# Patient Record
Sex: Male | Born: 1990
Health system: Southern US, Community
[De-identification: ages and names within clinical notes are randomized; demographics above are authoritative.]

## PROBLEM LIST (undated history)

## (undated) DIAGNOSIS — I10 Essential (primary) hypertension: Secondary | ICD-10-CM

## (undated) DIAGNOSIS — E119 Type 2 diabetes mellitus without complications: Secondary | ICD-10-CM

## (undated) HISTORY — PX: APPENDECTOMY: SHX54

---
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
## 2009-12-16 ENCOUNTER — Emergency Department (HOSPITAL_COMMUNITY): Admission: EM | Admit: 2009-12-16 | Discharge: 2009-12-16 | Payer: Self-pay | Admitting: Emergency Medicine

## 2010-07-05 LAB — DIFFERENTIAL
Basophils Absolute: 0 10*3/uL (ref 0.0–0.1)
Lymphocytes Relative: 15 % (ref 12–46)
Lymphs Abs: 1.6 10*3/uL (ref 0.7–4.0)
Monocytes Absolute: 0.5 10*3/uL (ref 0.1–1.0)
Monocytes Relative: 5 % (ref 3–12)
Neutro Abs: 8.9 10*3/uL — ABNORMAL HIGH (ref 1.7–7.7)

## 2010-07-05 LAB — CBC
Hemoglobin: 14.1 g/dL (ref 13.0–17.0)
RBC: 4.62 MIL/uL (ref 4.22–5.81)
WBC: 11.1 10*3/uL — ABNORMAL HIGH (ref 4.0–10.5)

## 2010-07-05 LAB — BASIC METABOLIC PANEL
Calcium: 10 mg/dL (ref 8.4–10.5)
GFR calc Af Amer: >60 mL/min (ref >60)
GFR calc non Af Amer: >60 mL/min (ref >60)
Sodium: 139 mEq/L (ref 135–145)

## 2010-07-05 LAB — URINALYSIS, ROUTINE W REFLEX MICROSCOPIC
Bilirubin Urine: NEGATIVE
Hgb urine dipstick: NEGATIVE
Nitrite: NEGATIVE
Specific Gravity, Urine: 1.03 (ref 1.005–1.030)
pH: 7 (ref 5.0–8.0)

## 2010-08-11 NOTE — Op Note (Signed)
Rodney Rush, Rodney Rush NO.:  [id number]   MEDICAL RECORD NO.:  [id number]          PATIENT TYPE:  INP   LOCATION:  1859                         FACILITY:  [facility]   PHYSICIAN:  Wilmon Arms. Corliss Skains, M.D. DATE OF BIRTH:  12-12-1990   DATE OF PROCEDURE:  11/10/2008  DATE OF DISCHARGE:                               OPERATIVE REPORT   PREOPERATIVE DIAGNOSIS:  Acute appendicitis.   POSTOPERATIVE DIAGNOSIS:  Acute appendicitis.   PROCEDURE PERFORMED:  Laparoscopic appendectomy.   SURGEON:  Wilmon Arms. Tsuei, MD   ANESTHESIA:  General endotracheal.   INDICATIONS:  This is a 20 year old male who presents with a one-day  history of right lower quadrant pain, nausea and diarrhea.  His white  count is 11.1.  A CT scan showed a thickened appendix with inflammatory  changes, but no sign of abscess.  He presents now for emergent  appendectomy.   DESCRIPTION OF PROCEDURE:  The patient was brought to the operating room  and placed in supine position on the operating table.  After an adequate  level of general anesthesia was obtained, the patient's abdomen was  prepped with Betadine and draped in sterile fashion.  A Foley catheter  has previously been placed.  He also received preoperative antibiotics  of Unasyn 3 g.   A time-out was taken to assure the proper patient and proper procedure.  We infiltrated the area below his umbilicus with 0.25% Marcaine with  epinephrine.  We made a transverse incision and dissection was carried  down to the fascia.  The fascia was opened vertically.  A stay sutures  of 0 Vicryl was placed around the fascial opening.  Pneumoperitoneum was  obtained by inserting a Hasson cannula, secured with stay sutures and  insufflating CO2 maintaining maximal pressure of 15 mmHg.  The  laparoscope was then inserted.  He was positioned in Trendelenburg and  tilted to his left.  A 5-mm port was placed in the right upper quadrant  and another 5-mm port was  placed in the left lower quadrant.  The cecum  was mobilized medially.  We identified the tip of the appendix.  There  is some fibrinous exudate, but no gross purulence and no sign of  perforation.  We grasped the tip of the appendix with a Glassman clamp.  The visualized appendix was quite short.  I felt that this did not  represent the entire appendix.  The appendix actually had congenital  adhesions to the lateral abdominal wall and tracked in a retrocecal  fashion.  We then spent considerable amount of time mobilizing the  entire appendix back to the base at the cecum.  The base of the appendix  was then divided with Endo-GIA stapler.  The appendix was placed in  EndoCatch sac and removed with umbilical port site.  We reinspected the  staple line and no bleeding was noted.  We thoroughly irrigated the  right lower quadrant.  Hemostasis was good.  We removed the trocars as  pneumoperitoneum was  released.  The umbilical fascia was closed with a pursestring suture.  A  4-0  Monocryl was used to close the skin incisions.  Steri-Strips and  clean dressings were applied.  The patient was then extubated and  brought to recovery room in stable condition.  All sponge, instrument,  and needle counts were correct.      Wilmon Arms. Tsuei, M.D.  Electronically Signed     MKT/MEDQ  D:  11/10/2008  T:  11/10/2008  Job:  [id number]

## 2011-04-29 ENCOUNTER — Encounter (HOSPITAL_COMMUNITY): Payer: Self-pay | Admitting: Emergency Medicine

## 2011-04-29 ENCOUNTER — Emergency Department (HOSPITAL_COMMUNITY): Payer: BC Managed Care – PPO

## 2011-04-29 ENCOUNTER — Emergency Department (HOSPITAL_COMMUNITY)
Admission: EM | Admit: 2011-04-29 | Discharge: 2011-04-29 | Disposition: A | Discharge status: Home / Self Care | Payer: BC Managed Care – PPO | Attending: Emergency Medicine | Admitting: Emergency Medicine

## 2011-04-29 DIAGNOSIS — M79609 Pain in unspecified limb: Secondary | ICD-10-CM | POA: Insufficient documentation

## 2011-04-29 DIAGNOSIS — T148XXA Other injury of unspecified body region, initial encounter: Secondary | ICD-10-CM

## 2011-04-29 DIAGNOSIS — W268XXA Contact with other sharp object(s), not elsewhere classified, initial encounter: Secondary | ICD-10-CM | POA: Insufficient documentation

## 2011-04-29 DIAGNOSIS — S91309A Unspecified open wound, unspecified foot, initial encounter: Secondary | ICD-10-CM | POA: Insufficient documentation

## 2011-04-29 MED ORDER — IBUPROFEN 800 MG PO TABS: 800.0000 mg | ORAL_TABLET | Freq: Once | ORAL | Status: AC

## 2011-04-29 MED ORDER — IBUPROFEN 800 MG PO TABS
800.0000 mg | ORAL_TABLET | Freq: Once | ORAL | Status: AC
  Administered 2011-04-29: 800 mg via ORAL
  Filled 2011-04-29: qty 1

## 2011-04-29 MED ORDER — TETANUS-DIPHTH-ACELL PERTUSSIS 5-2.5-18.5 LF-MCG/0.5 IM SUSP
0.5000 mL | Freq: Once | INTRAMUSCULAR | Status: AC
  Administered 2011-04-29: 0.5 mL via INTRAMUSCULAR
  Filled 2011-04-29: qty 0.5

## 2011-04-29 NOTE — ED Provider Notes (Signed)
History     CSN: [id number]  Arrival date & time 04/29/11  0030   First MD Initiated Contact with Patient 04/29/11 0050      Chief Complaint  Patient presents with  . Puncture Wound    (Consider location/radiation/quality/duration/timing/severity/associated sxs/prior treatment) HPI Comments: Stepped on a nail in the leads at 6:00 tonight through a shoe initially didn't think about it, but it started to throb.  Several hours later.  There is a small puncture to the sole of his right foot, just beneath the ball of the foot it's not red and inflamed.     The history is provided by the patient.    History reviewed. No pertinent past medical history.  History reviewed. No pertinent past surgical history.  No family history on file.  History  Substance Use Topics  . Smoking status: Never Smoker   . Smokeless tobacco: Not on file  . Alcohol Use: No      Review of Systems  Constitutional: Negative for fever and chills.  Musculoskeletal: Negative for joint swelling.  Skin: Positive for wound.    Allergies  Review of patient's allergies indicates no known allergies.  Home Medications   Current Outpatient Rx  Name Route Sig Dispense Refill  . IBUPROFEN 200 MG PO TABS Oral Take 200 mg by mouth every 6 (six) hours as needed. headache    . IBUPROFEN 800 MG PO TABS Oral Take 1 tablet (800 mg total) by mouth once. 30 tablet 0    BP 133/76  Pulse 91  Temp(Src) 98 F (36.7 C) (Oral)  Resp 18  SpO2 94%  Physical Exam  Constitutional: He is oriented to person, place, and time. He appears well-developed and well-nourished.  Neck: Normal range of motion.  Cardiovascular: Normal rate.   Musculoskeletal:       Small puncture wound to the sole of the right foot, just below the ball.  No erythema, drainage, no foreign body, felt  Neurological: He is alert and oriented to person, place, and time.  Skin: Skin is warm and dry.    ED Course  Procedures (including critical care  time)  Labs Reviewed - No data to display Dg Foot 2 Views Right  04/29/2011  *RADIOLOGY REPORT*  Clinical Data: Stepped on nail.  Marker over injury between fourth and fifth metatarsal.  RIGHT FOOT - 2 VIEW  Comparison: 12/16/2009  Findings: No radiopaque foreign bodies in the soft tissues.  Bones appear intact.  No evidence of acute fracture or subluxation.  No focal bone lesion or bone destruction.  Bone cortex and trabecular architecture appear intact.  No focal soft tissue swelling or gas.  IMPRESSION: No acute bony abnormalities.  No radiopaque foreign bodies.  Original Report Authenticated By: Marlon Pel, M.D.     1. Puncture wound       MDM  Puncture wound to the sole of the right, foot.  We'll x-ray to verify foreign body versus no foreign, body.  We'll update tetanus        Arman Filter, NP 04/29/11 0100  Arman Filter, NP 04/29/11 [phone]

## 2011-04-29 NOTE — ED Notes (Signed)
PT. REPORTS STEPPED ON NAIL THIS EVENING AT RIGHT FOOT , NO BLEEDING , TETANUS IMMUNIZATION > 10 YEARS AGO.

## 2011-04-29 NOTE — ED Notes (Signed)
Patient transported to X-ray 

## 2011-04-30 NOTE — ED Provider Notes (Signed)
Medical screening examination/treatment/procedure(s) were performed by non-physician practitioner and as supervising physician I was immediately available for consultation/collaboration.   Jaysion Ramseyer M Daryle Boyington, DO 04/30/11 0854 

## 2013-09-12 ENCOUNTER — Other Ambulatory Visit: Payer: Self-pay | Admitting: Family Medicine

## 2013-09-12 ENCOUNTER — Ambulatory Visit
Admission: RE | Admit: 2013-09-12 | Discharge: 2013-09-12 | Disposition: A | Discharge status: Home / Self Care | Payer: BC Managed Care – PPO | Source: Ambulatory Visit | Attending: Family Medicine | Admitting: Family Medicine

## 2013-09-12 DIAGNOSIS — M79672 Pain in left foot: Secondary | ICD-10-CM

## 2015-08-06 ENCOUNTER — Emergency Department (HOSPITAL_BASED_OUTPATIENT_CLINIC_OR_DEPARTMENT_OTHER)
Admission: EM | Admit: 2015-08-06 | Discharge: 2015-08-07 | Disposition: A | Discharge status: Home / Self Care | Payer: Self-pay | Attending: Dermatology | Admitting: Dermatology

## 2015-08-06 ENCOUNTER — Encounter (HOSPITAL_BASED_OUTPATIENT_CLINIC_OR_DEPARTMENT_OTHER): Payer: Self-pay

## 2015-08-06 DIAGNOSIS — Z5321 Procedure and treatment not carried out due to patient leaving prior to being seen by health care provider: Secondary | ICD-10-CM | POA: Insufficient documentation

## 2015-08-06 DIAGNOSIS — Z202 Contact with and (suspected) exposure to infections with a predominantly sexual mode of transmission: Secondary | ICD-10-CM | POA: Insufficient documentation

## 2015-08-06 NOTE — ED Notes (Signed)
Pt not in ED WR when called for tx area 

## 2015-08-06 NOTE — ED Notes (Signed)
Pt reports wife positive for STD-denies penile d/c and dysuria-NAD-steady gait

## 2015-08-06 NOTE — ED Notes (Signed)
second call to waiting area no reply from pt

## 2015-08-07 ENCOUNTER — Encounter (HOSPITAL_BASED_OUTPATIENT_CLINIC_OR_DEPARTMENT_OTHER): Payer: Self-pay

## 2015-08-07 ENCOUNTER — Emergency Department (HOSPITAL_BASED_OUTPATIENT_CLINIC_OR_DEPARTMENT_OTHER)
Admission: EM | Admit: 2015-08-07 | Discharge: 2015-08-07 | Disposition: A | Discharge status: Home / Self Care | Payer: BLUE CROSS/BLUE SHIELD | Attending: Emergency Medicine | Admitting: Emergency Medicine

## 2015-08-07 DIAGNOSIS — Z202 Contact with and (suspected) exposure to infections with a predominantly sexual mode of transmission: Secondary | ICD-10-CM | POA: Insufficient documentation

## 2015-08-07 MED ORDER — LIDOCAINE HCL (PF) 1 % IJ SOLN
INTRAMUSCULAR | Status: AC
  Administered 2015-08-07: 0.9 mL
  Filled 2015-08-07: qty 5

## 2015-08-07 MED ORDER — AZITHROMYCIN 250 MG PO TABS
1000.0000 mg | ORAL_TABLET | Freq: Once | ORAL | Status: AC
  Administered 2015-08-07: 1000 mg via ORAL
  Filled 2015-08-07: qty 4

## 2015-08-07 MED ORDER — CEFTRIAXONE SODIUM 250 MG IJ SOLR
250.0000 mg | Freq: Once | INTRAMUSCULAR | Status: AC
  Administered 2015-08-07: 250 mg via INTRAMUSCULAR
  Filled 2015-08-07: qty 250

## 2015-08-07 NOTE — ED Notes (Signed)
Pt reports wife positive for gonorrhea-pt denies penile d/c and dysuria-NAD-steady gait

## 2015-08-07 NOTE — ED Provider Notes (Signed)
CSN: [id number]     Arrival date & time 08/07/15  1545 History   First MD Initiated Contact with Patient 08/07/15 1555     Chief Complaint  Patient presents with  . Exposure to STD     (Consider location/radiation/quality/duration/timing/severity/associated sxs/prior Treatment) HPI   Blood pressure 134/82, pulse 90, temperature 98.5 F (36.9 C), temperature source Oral, resp. rate 16, height 6' (1.829 m), weight 125.646 kg, SpO2 97 %.  Philomena Coursendrade J Treece is a 25 y.o. male  presenting for treatment of gonorrhea, patient states his wife tested positive for this yesterday. States he has monogamous sex only with her, has not been tested for STDs recently. He denies urethral discharge, fever, chills, testicular pain or swelling, rash.  History reviewed. No pertinent past medical history. Past Surgical History  Procedure Laterality Date  . Appendectomy     No family history on file. Social History  Substance Use Topics  . Smoking status: Never Smoker   . Smokeless tobacco: None  . Alcohol Use: No    Review of Systems  10 systems reviewed and found to be negative, except as noted in the HPI.  Allergies  Review of patient's allergies indicates no known allergies.  Home Medications   Prior to Admission medications   Not on File   BP 134/82 mmHg  Pulse 90  Temp(Src) 98.5 F (36.9 C) (Oral)  Resp 16  Ht 6' (1.829 m)  Wt 125.646 kg  BMI 37.56 kg/m2  SpO2 97% Physical Exam  Constitutional: He is oriented to person, place, and time. He appears well-developed and well-nourished. No distress.  HENT:  Head: Normocephalic.  Eyes: Conjunctivae and EOM are normal.  Cardiovascular: Normal rate.   Pulmonary/Chest: Effort normal. No stridor.  Genitourinary:  GU exam a chaperoned by nurse: No rashes or lesions, patient is circumcised, no gross urethral discharge.  Musculoskeletal: Normal range of motion.  Neurological: He is alert and oriented to person, place, and time.   Psychiatric: He has a normal mood and affect.  Nursing note and vitals reviewed.   ED Course  Procedures (including critical care time) Labs Review Labs Reviewed  RPR  HIV ANTIBODY (ROUTINE TESTING)  GC/CHLAMYDIA PROBE AMP ([place]    Imaging Review No results found. I have personally reviewed and evaluated these images and lab results as part of my medical decision-making.   EKG Interpretation None      MDM   Final diagnoses:  Exposure to sexually transmitted disease (STD)    Filed Vitals:   08/07/15 1551  BP: 134/82  Pulse: 90  Temp: 98.5 F (36.9 C)  TempSrc: Oral  Resp: 16  Height: 6' (1.829 m)  Weight: 125.646 kg  SpO2: 97%    Medications  cefTRIAXone (ROCEPHIN) injection 250 mg (not administered)  azithromycin (ZITHROMAX) tablet 1,000 mg (not administered)    Philomena Coursendrade J Stroud is 25 y.o. male presenting For treatment of gonorrhea, patient is asymptomatic, full STD testing pending. Patient treated for gonorrhea and Chlamydia.  Evaluation does not show pathology that would require ongoing emergent intervention or inpatient treatment. Pt is hemodynamically stable and mentating appropriately. Discussed findings and plan with patient/guardian, who agrees with care plan. All questions answered. Return precautions discussed and outpatient follow up given.     Wynetta Emeryicole Tashea Othman, PA-C 08/07/15 1610  Tilden FossaElizabeth Rees, MD 08/07/15 [phone]

## 2015-08-07 NOTE — Discharge Instructions (Signed)
You were not tested for all STDs today. Your gonorrhea and chlamydia tests are pending- if they are positive, you will receive a phone call. Refrain from sex until you have the results from a full STD screen. Please go to [facility] (Address: [address], [place], [place] [address] Phone: [phone]) or see the [facility] (Address: [address] Phone: [phone]) for full STD screening. Return to the emergency room for worsening of symptoms, fever, and vomiting.   Sexually Transmitted Disease A sexually transmitted disease (STD) is a disease or infection that may be passed (transmitted) from person to person, usually during sexual activity. This may happen by way of saliva, semen, blood, vaginal mucus, or urine. Common STDs include:  Gonorrhea.  Chlamydia.  Syphilis.  HIV and AIDS.  Genital herpes.  Hepatitis B and C.  Trichomonas.  Human papillomavirus (HPV).  Pubic lice.  Scabies.  Mites.  Bacterial vaginosis. WHAT ARE CAUSES OF STDs? An STD may be caused by bacteria, a virus, or parasites. STDs are often transmitted during sexual activity if one person is infected. However, they may also be transmitted through nonsexual means. STDs may be transmitted after:   Sexual intercourse with an infected person.  Sharing sex toys with an infected person.  Sharing needles with an infected person or using unclean piercing or tattoo needles.  Having intimate contact with the genitals, mouth, or rectal areas of an infected person.  Exposure to infected fluids during birth. WHAT ARE THE SIGNS AND SYMPTOMS OF STDs? Different STDs have different symptoms. Some people may not have any symptoms. If symptoms are present, they may include:  Painful or bloody urination.  Pain in the pelvis, abdomen, vagina, anus, throat, or eyes.  A skin rash, itching, or irritation.  Growths, ulcerations, blisters, or sores in the genital and anal  areas.  Abnormal vaginal discharge with or without bad odor.  Penile discharge in men.  Fever.  Pain or bleeding during sexual intercourse.  Swollen glands in the groin area.  Yellow skin and eyes (jaundice). This is seen with hepatitis.  Swollen testicles.  Infertility.  Sores and blisters in the mouth. HOW ARE STDs DIAGNOSED? To make a diagnosis, your health care provider may:  Take a medical history.  Perform a physical exam.  Take a sample of any discharge to examine.  Swab the throat, cervix, opening to the penis, rectum, or vagina for testing.  Test a sample of your first morning urine.  Perform blood tests.  Perform a Pap test, if this applies.  Perform a colposcopy.  Perform a laparoscopy. HOW ARE STDs TREATED? Treatment depends on the STD. Some STDs may be treated but not cured.  Chlamydia, gonorrhea, trichomonas, and syphilis can be cured with antibiotic medicine.  Genital herpes, hepatitis, and HIV can be treated, but not cured, with prescribed medicines. The medicines lessen symptoms.  Genital warts from HPV can be treated with medicine or by freezing, burning (electrocautery), or surgery. Warts may come back.  HPV cannot be cured with medicine or surgery. However, abnormal areas may be removed from the cervix, vagina, or vulva.  If your diagnosis is confirmed, your recent sexual partners need treatment. This is true even if they are symptom-free or have a negative culture or evaluation. They should not have sex until their health care providers say it is okay.  Your health care provider may test you for infection again 3 months after treatment. HOW CAN I REDUCE MY RISK OF GETTING AN  STD? Take these steps to reduce your risk of getting an STD:  Use latex condoms, dental dams, and water-soluble lubricants during sexual activity. Do not use petroleum jelly or oils.  Avoid having multiple sex partners.  Do not have sex with someone who has other  sex partners  Do not have sex with anyone you do not know or who is at high risk for an STD.  Avoid risky sex practices that can break your skin.  Do not have sex if you have open sores on your mouth or skin.  Avoid drinking too much alcohol or taking illegal drugs. Alcohol and drugs can affect your judgment and put you in a vulnerable position.  Avoid engaging in oral and anal sex acts.  Get vaccinated for HPV and hepatitis. If you have not received these vaccines in the past, talk to your health care provider about whether one or both might be right for you.  If you are at risk of being infected with HIV, it is recommended that you take a prescription medicine daily to prevent HIV infection. This is called pre-exposure prophylaxis (PrEP). You are considered at risk if:  You are a man who has sex with other men (MSM).  You are a heterosexual man or woman and are sexually active with more than one partner.  You take drugs by injection.  You are sexually active with a partner who has HIV.  Talk with your health care provider about whether you are at high risk of being infected with HIV. If you choose to begin PrEP, you should first be tested for HIV. You should then be tested every 3 months for as long as you are taking PrEP. WHAT SHOULD I DO IF I THINK I HAVE AN STD?  See your health care provider.  Tell your sexual partner(s). They should be tested and treated for any STDs.  Do not have sex until your health care provider says it is okay. WHEN SHOULD I GET IMMEDIATE MEDICAL CARE? Contact your health care provider right away if:   You have severe abdominal pain.  You are a man and notice swelling or pain in your testicles.  You are a woman and notice swelling or pain in your vagina.   This information is not intended to replace advice given to you by your health care provider. Make sure you discuss any questions you have with your health care provider.   Document Released:  06/05/2002 Document Revised: 04/05/2014 Document Reviewed: 10/03/2012 Elsevier Interactive Patient Education 2016 [employer].  Sexually Transmitted Disease A sexually transmitted disease (STD) is a disease or infection that may be passed (transmitted) from person to person, usually during sexual activity. This may happen by way of saliva, semen, blood, vaginal mucus, or urine. Common STDs include:  Gonorrhea.  Chlamydia.  Syphilis.  HIV and AIDS.  Genital herpes.  Hepatitis B and C.  Trichomonas.  Human papillomavirus (HPV).  Pubic lice.  Scabies.  Mites.  Bacterial vaginosis. WHAT ARE CAUSES OF STDs? An STD may be caused by bacteria, a virus, or parasites. STDs are often transmitted during sexual activity if one person is infected. However, they may also be transmitted through nonsexual means. STDs may be transmitted after:   Sexual intercourse with an infected person.  Sharing sex toys with an infected person.  Sharing needles with an infected person or using unclean piercing or tattoo needles.  Having intimate contact with the genitals, mouth, or rectal areas of an infected person.  Exposure to infected fluids during birth. WHAT ARE THE SIGNS AND SYMPTOMS OF STDs? Different STDs have different symptoms. Some people may not have any symptoms. If symptoms are present, they may include:  Painful or bloody urination.  Pain in the pelvis, abdomen, vagina, anus, throat, or eyes.  A skin rash, itching, or irritation.  Growths, ulcerations, blisters, or sores in the genital and anal areas.  Abnormal vaginal discharge with or without bad odor.  Penile discharge in men.  Fever.  Pain or bleeding during sexual intercourse.  Swollen glands in the groin area.  Yellow skin and eyes (jaundice). This is seen with hepatitis.  Swollen testicles.  Infertility.  Sores and blisters in the mouth. HOW ARE STDs DIAGNOSED? To make a diagnosis, your health care  provider may:  Take a medical history.  Perform a physical exam.  Take a sample of any discharge to examine.  Swab the throat, cervix, opening to the penis, rectum, or vagina for testing.  Test a sample of your first morning urine.  Perform blood tests.  Perform a Pap test, if this applies.  Perform a colposcopy.  Perform a laparoscopy. HOW ARE STDs TREATED? Treatment depends on the STD. Some STDs may be treated but not cured.  Chlamydia, gonorrhea, trichomonas, and syphilis can be cured with antibiotic medicine.  Genital herpes, hepatitis, and HIV can be treated, but not cured, with prescribed medicines. The medicines lessen symptoms.  Genital warts from HPV can be treated with medicine or by freezing, burning (electrocautery), or surgery. Warts may come back.  HPV cannot be cured with medicine or surgery. However, abnormal areas may be removed from the cervix, vagina, or vulva.  If your diagnosis is confirmed, your recent sexual partners need treatment. This is true even if they are symptom-free or have a negative culture or evaluation. They should not have sex until their health care providers say it is okay.  Your health care provider may test you for infection again 3 months after treatment. HOW CAN I REDUCE MY RISK OF GETTING AN STD? Take these steps to reduce your risk of getting an STD:  Use latex condoms, dental dams, and water-soluble lubricants during sexual activity. Do not use petroleum jelly or oils.  Avoid having multiple sex partners.  Do not have sex with someone who has other sex partners  Do not have sex with anyone you do not know or who is at high risk for an STD.  Avoid risky sex practices that can break your skin.  Do not have sex if you have open sores on your mouth or skin.  Avoid drinking too much alcohol or taking illegal drugs. Alcohol and drugs can affect your judgment and put you in a vulnerable position.  Avoid engaging in oral and anal  sex acts.  Get vaccinated for HPV and hepatitis. If you have not received these vaccines in the past, talk to your health care provider about whether one or both might be right for you.  If you are at risk of being infected with HIV, it is recommended that you take a prescription medicine daily to prevent HIV infection. This is called pre-exposure prophylaxis (PrEP). You are considered at risk if:  You are a man who has sex with other men (MSM).  You are a heterosexual man or woman and are sexually active with more than one partner.  You take drugs by injection.  You are sexually active with a partner who has HIV.  Talk with your  health care provider about whether you are at high risk of being infected with HIV. If you choose to begin PrEP, you should first be tested for HIV. You should then be tested every 3 months for as long as you are taking PrEP. WHAT SHOULD I DO IF I THINK I HAVE AN STD?  See your health care provider.  Tell your sexual partner(s). They should be tested and treated for any STDs.  Do not have sex until your health care provider says it is okay. WHEN SHOULD I GET IMMEDIATE MEDICAL CARE? Contact your health care provider right away if:   You have severe abdominal pain.  You are a man and notice swelling or pain in your testicles.  You are a woman and notice swelling or pain in your vagina.   This information is not intended to replace advice given to you by your health care provider. Make sure you discuss any questions you have with your health care provider.   Document Released: 06/05/2002 Document Revised: 04/05/2014 Document Reviewed: 10/03/2012 Elsevier Interactive Patient Education [employer]

## 2015-08-08 LAB — GC/CHLAMYDIA PROBE AMP (~~LOC~~) NOT AT ARMC
Chlamydia: NEGATIVE
Neisseria Gonorrhea: NEGATIVE

## 2015-08-08 LAB — HIV ANTIBODY (ROUTINE TESTING W REFLEX): HIV SCREEN 4TH GENERATION: NONREACTIVE

## 2015-08-08 LAB — RPR: RPR Ser Ql: NONREACTIVE

## 2017-04-17 ENCOUNTER — Other Ambulatory Visit: Payer: Self-pay

## 2017-04-17 ENCOUNTER — Emergency Department (HOSPITAL_COMMUNITY)
Admission: EM | Admit: 2017-04-17 | Discharge: 2017-04-17 | Disposition: A | Discharge status: Home / Self Care | Payer: BLUE CROSS/BLUE SHIELD | Attending: Emergency Medicine | Admitting: Emergency Medicine

## 2017-04-17 ENCOUNTER — Emergency Department (HOSPITAL_COMMUNITY): Payer: BLUE CROSS/BLUE SHIELD

## 2017-04-17 ENCOUNTER — Encounter (HOSPITAL_COMMUNITY): Payer: Self-pay

## 2017-04-17 DIAGNOSIS — R091 Pleurisy: Secondary | ICD-10-CM | POA: Diagnosis not present

## 2017-04-17 DIAGNOSIS — R079 Chest pain, unspecified: Secondary | ICD-10-CM | POA: Diagnosis present

## 2017-04-17 LAB — CBC
HCT: 39.9 % (ref 39.0–52.0)
Hemoglobin: 13.8 g/dL (ref 13.0–17.0)
MCH: 30.1 pg (ref 26.0–34.0)
MCHC: 34.6 g/dL (ref 30.0–36.0)
MCV: 87.1 fL (ref 78.0–100.0)
Platelets: 353 10*3/uL (ref 150–400)
RBC: 4.58 MIL/uL (ref 4.22–5.81)
RDW: 13 % (ref 11.5–15.5)
WBC: 5.5 10*3/uL (ref 4.0–10.5)

## 2017-04-17 LAB — I-STAT TROPONIN, ED: TROPONIN I, POC: 0 ng/mL (ref 0.00–0.08)

## 2017-04-17 LAB — BASIC METABOLIC PANEL
ANION GAP: 11 (ref 5–15)
BUN: 9 mg/dL (ref 6–20)
CALCIUM: 9.6 mg/dL (ref 8.9–10.3)
CO2: 24 mmol/L (ref 22–32)
Chloride: 105 mmol/L (ref 101–111)
Creatinine, Ser: 0.93 mg/dL (ref 0.61–1.24)
GFR calc Af Amer: >60 mL/min (ref >60)
GLUCOSE: 97 mg/dL (ref 65–99)
Potassium: 4.1 mmol/L (ref 3.5–5.1)
SODIUM: 140 mmol/L (ref 135–145)

## 2017-04-17 NOTE — ED Provider Notes (Signed)
[facility] EMERGENCY DEPARTMENT Provider Note   CSN: [id number] Arrival date & time: 04/17/17  1246     History   Chief Complaint Chief Complaint  Patient presents with  . Chest Pain    HPI Rodney Rush is a 27 y.o. male.  Chief complaint is sharp left-sided chest pain.  HPI 27 year old male.  3 days of left anterior chest pain.  Sharp.  It hurts to take a deep breath.  No trauma.  No skin rash.  No cough.  No shortness of breath.  States he is just afraid to take a breath because "it hurts".  No recent prolonged immobilization, casts, splints, fractures, hormone use, or DVT risks.  No fever cough or URI symptoms.  History reviewed. No pertinent past medical history.  There are no active problems to display for this patient.   Past Surgical History:  Procedure Laterality Date  . APPENDECTOMY         Home Medications    Prior to Admission medications   Not on File    Family History No family history on file.  Social History Social History   Tobacco Use  . Smoking status: Never Smoker  . Smokeless tobacco: Never Used  Substance Use Topics  . Alcohol use: No  . Drug use: No     Allergies   Ibuprofen   Review of Systems Review of Systems  Constitutional: Negative for appetite change, chills, diaphoresis, fatigue and fever.  HENT: Negative for mouth sores, sore throat and trouble swallowing.   Eyes: Negative for visual disturbance.  Respiratory: Negative for cough, chest tightness, shortness of breath and wheezing.   Cardiovascular: Positive for chest pain.  Gastrointestinal: Negative for abdominal distention, abdominal pain, diarrhea, nausea and vomiting.  Endocrine: Negative for polydipsia, polyphagia and polyuria.  Genitourinary: Negative for dysuria, frequency and hematuria.  Musculoskeletal: Negative for gait problem.  Skin: Negative for color change, pallor and rash.  Neurological: Negative for dizziness, syncope,  light-headedness and headaches.  Hematological: Does not bruise/bleed easily.  Psychiatric/Behavioral: Negative for behavioral problems and confusion.     Physical Exam Updated Vital Signs BP 140/86 (BP Location: Right Arm)   Pulse 73   Temp 98 F (36.7 C) (Oral)   Resp 18   SpO2 97%   Physical Exam  Constitutional: He is oriented to person, place, and time. He appears well-developed and well-nourished. No distress.  HENT:  Head: Normocephalic.  Eyes: Conjunctivae are normal. Pupils are equal, round, and reactive to light. No scleral icterus.  Neck: Normal range of motion. Neck supple. No thyromegaly present.  Cardiovascular: Normal rate and regular rhythm. Exam reveals no gallop and no friction rub.  No murmur heard. Pulmonary/Chest: Effort normal and breath sounds normal. No respiratory distress. He has no wheezes. He has no rales.  Normal cardiac pulmonary exam.  Normal heart tones.  Clear lungs.  No crepitus.  No subcu air.  No rash or skin sensitivity.  Abdominal: Soft. Bowel sounds are normal. He exhibits no distension. There is no tenderness. There is no rebound.  Musculoskeletal: Normal range of motion.  Neurological: He is alert and oriented to person, place, and time.  Skin: Skin is warm and dry. No rash noted.  Psychiatric: He has a normal mood and affect. His behavior is normal.     ED Treatments / Results  Labs (all labs ordered are listed, but only abnormal results are displayed) Labs Reviewed  BASIC METABOLIC PANEL  CBC  I-STAT TROPONIN, ED  EKG  EKG Interpretation  Date/Time:  Sunday April 17 2017 12:53:31 EST Ventricular Rate:  87 PR Interval:  134 QRS Duration: 96 QT Interval:  354 QTC Calculation: 425 R Axis:   83 Text Interpretation:  Normal sinus rhythm Normal ECG Confirmed by Rolland PorterJames, Terianna Peggs ([id number]) on 04/17/2017 1:08:14 PM       Radiology Dg Chest 2 View  Result Date: 04/17/2017 CLINICAL DATA:  27 year old male with left side chest  pain and shortness of breath for 4 days. EXAM: CHEST  2 VIEW COMPARISON:  11/11/2008 and earlier FINDINGS: Stable lung volumes. Mediastinal contours remain normal. Visualized tracheal air column is within normal limits. No pneumothorax, pleural effusion or confluent pulmonary opacity. Questionable mild increased interstitial markings in both lungs. Negative visible bowel gas pattern. No osseous abnormality identified. IMPRESSION: Negative aside from questionable mild increased interstitial markings in both lungs, such as due to viral or atypical respiratory infection. Electronically Signed   By: Odessa FlemingH  Hall M.D.   On: 04/17/2017 14:17    Procedures Procedures (including critical care time)  Medications Ordered in ED Medications - No data to display   Initial Impression / Assessment and Plan / ED Course  I have reviewed the triage vital signs and the nursing notes.  Pertinent labs & imaging results that were available during my care of the patient were reviewed by me and considered in my medical decision making (see chart for details).     Normal EKG, hemoglobin, WBC, troponin, normal EKG, normal chest x-ray.  Signs consistent with simple pleuritis.  He is allergic to anti-inflammatories we will have to use simple Tylenol and time.  Final Clinical Impressions(s) / ED Diagnoses   Final diagnoses:  Pleurisy    ED Discharge Orders    None       Rolland PorterJames, Kitara Hebb, MD 04/17/17 1551

## 2017-04-17 NOTE — Discharge Instructions (Signed)
With your allergy to ibuprofen, and anti-inflammatory medicine, you will have to take Tylenol 1000 mg every 4-6 hours as needed for pain until your symptoms resolve.  Pleurisy only takes time to go away.  There is no specific treatment.

## 2017-04-17 NOTE — ED Triage Notes (Signed)
Pt presents for evaluation of L sided CP and sob since Wednesday. 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 Pt reports area is tender to touch but has not improved. Pt reports pain with deep breathing.

## 2017-10-31 ENCOUNTER — Emergency Department (HOSPITAL_BASED_OUTPATIENT_CLINIC_OR_DEPARTMENT_OTHER): Payer: BLUE CROSS/BLUE SHIELD

## 2017-10-31 ENCOUNTER — Other Ambulatory Visit: Payer: Self-pay

## 2017-10-31 ENCOUNTER — Encounter (HOSPITAL_BASED_OUTPATIENT_CLINIC_OR_DEPARTMENT_OTHER): Payer: Self-pay | Admitting: Emergency Medicine

## 2017-10-31 ENCOUNTER — Emergency Department (HOSPITAL_BASED_OUTPATIENT_CLINIC_OR_DEPARTMENT_OTHER)
Admission: EM | Admit: 2017-10-31 | Discharge: 2017-10-31 | Disposition: A | Discharge status: Home / Self Care | Payer: BLUE CROSS/BLUE SHIELD | Attending: Emergency Medicine | Admitting: Emergency Medicine

## 2017-10-31 DIAGNOSIS — R0789 Other chest pain: Secondary | ICD-10-CM | POA: Insufficient documentation

## 2017-10-31 DIAGNOSIS — R079 Chest pain, unspecified: Secondary | ICD-10-CM | POA: Diagnosis present

## 2017-10-31 MED ORDER — METHOCARBAMOL 500 MG PO TABS: 1000.0000 mg | ORAL_TABLET | Freq: Four times a day (QID) | ORAL | 0 refills | Status: DC

## 2017-10-31 NOTE — ED Provider Notes (Signed)
[facility] EMERGENCY DEPARTMENT Provider Note   CSN: [id number] Arrival date & time: 10/31/17  1145     History   Chief Complaint Chief Complaint  Patient presents with  . Chest Pain    HPI Rodney Rush is a 27 y.o. male.  Patient presents with 3 to 4 weeks of left-sided chest pain and is intermittent.  He states that the pain can occur with activity or with rest.  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  He has additional episodes of shortness of breath which are short-lived.  This can occur with the chest pain and sometimes without the chest pain.  Symptoms are nonexertional.  He does not endorse any vomiting or diaphoresis.  Sometimes he feels palpitations.  No history of high blood pressure, high cholesterol, diabetes, smoking.  His father had a "mini heart attack" at age 69.  No family history of cardiac disease or sudden cardiac death at a young age. No treatments prior to arrival.  Reports recent car ride to [name].  Patient denies risk factors for pulmonary embolism including: unilateral leg swelling, history of DVT/PE/other blood clots, use of exogenous hormones, recent surgery, recent travel (>4hr segment), malignancy, hemoptysis.       No past medical history on file.  There are no active problems to display for this patient.   Past Surgical History:  Procedure Laterality Date  . APPENDECTOMY          Home Medications    Prior to Admission medications   Not on File    Family History No family history on file.  Social History Social History   Tobacco Use  . Smoking status: Never Smoker  . Smokeless tobacco: Never Used  Substance Use Topics  . Alcohol use: No  . Drug use: No     Allergies   Ibuprofen   Review of Systems Review of Systems  Constitutional: Negative for diaphoresis and fever.  Eyes: Negative for redness.  Respiratory: Negative for cough and shortness of breath.   Cardiovascular: Positive for chest  pain. Negative for palpitations and leg swelling.  Gastrointestinal: Negative for abdominal pain, nausea and vomiting.  Genitourinary: Negative for dysuria.  Musculoskeletal: Negative for back pain and neck pain.  Skin: Negative for rash.  Neurological: Negative for syncope and light-headedness.  Psychiatric/Behavioral: The patient is not nervous/anxious.      Physical Exam Updated Vital Signs BP (!) 155/98 (BP Location: Right Arm)   Pulse 81   Temp 98.5 F (36.9 C) (Oral)   Resp 18   Ht 6' (1.829 m)   Wt 122.5 kg (270 lb)   SpO2 100%   BMI 36.62 kg/m   Physical Exam  Constitutional: He appears well-developed and well-nourished.  HENT:  Head: Normocephalic and atraumatic.  Mouth/Throat: Mucous membranes are normal. Mucous membranes are not dry.  Eyes: Conjunctivae are normal.  Neck: Trachea normal and normal range of motion. Neck supple. Normal carotid pulses and no JVD present. No muscular tenderness present. Carotid bruit is not present. No tracheal deviation present.  Cardiovascular: Normal rate, regular rhythm, S1 normal, S2 normal, normal heart sounds and intact distal pulses. Exam reveals no distant heart sounds and no decreased pulses.  No murmur heard. Pulmonary/Chest: Effort normal and breath sounds normal. No respiratory distress. He has no wheezes. He exhibits no tenderness.  Abdominal: Soft. Normal aorta and bowel sounds are normal. There is no tenderness. There is no rebound and no guarding.  Musculoskeletal: He exhibits  no edema.  Neurological: He is alert.  Skin: Skin is warm and dry. He is not diaphoretic. No cyanosis. No pallor.  Psychiatric: He has a normal mood and affect.  Nursing note and vitals reviewed.    ED Treatments / Results  Labs (all labs ordered are listed, but only abnormal results are displayed) Labs Reviewed - No data to display  EKG EKG Interpretation  Date/Time:  Monday October 31 2017 11:51:59 EDT Ventricular Rate:  90 PR  Interval:    QRS Duration: 86 QT Interval:  360 QTC Calculation: 441 R Axis:   67 Text Interpretation:  Sinus rhythm No significant change since last tracing Confirmed by Gwyneth SproutPlunkett, Whitney ([id number]) on 10/31/2017 12:32:44 PM   Radiology Dg Chest 2 View  Result Date: 10/31/2017 CLINICAL DATA:  Left-sided chest pain.  Shortness of breath. EXAM: CHEST - 2 VIEW COMPARISON:  April 17, 2017 FINDINGS: The heart size and mediastinal contours are within normal limits. Both lungs are clear. The visualized skeletal structures are unremarkable. IMPRESSION: No active cardiopulmonary disease. Electronically Signed   By: Gerome Samavid  Williams III M.D   On: 10/31/2017 12:28    Procedures Procedures (including critical care time)  Medications Ordered in ED Medications - No data to display   Initial Impression / Assessment and Plan / ED Course  I have reviewed the triage vital signs and the nursing notes.  Pertinent labs & imaging results that were available during my care of the patient were reviewed by me and considered in my medical decision making (see chart for details).     Patient seen and examined. Reviewed previous visit for L CP.   Vital signs reviewed and are as follows: BP (!) 155/98 (BP Location: Right Arm)   Pulse 81   Temp 98.5 F (36.9 C) (Oral)   Resp 18   Ht 6' (1.829 m)   Wt 122.5 kg (270 lb)   SpO2 100%   BMI 36.62 kg/m    1:07 PM CXR and EKG reviewed.  EKG is completely normal and reassuring.  Chest x-ray is clear without signs of pneumothorax, infection, or cardiomegaly.  History is most consistent with chest wall pain.  Symptoms are intermittent and nonexertional.  No typical symptoms including vomiting, diaphoresis, radiation of pain.  He has no clinical signs of DVT on exam and symptoms would be unusual for PE as a well.  Patient with 100% oxygen saturation and normal heart rate.  Do not feel that further work-up is indicated at this time unless symptoms become more constant  or worsen or changed.  Encourage patient to follow-up with PCP and return with worsening chest pain, persistent shortness of breath, fevers, lightheadedness or syncope, new symptoms or other concerns. Patient verbalizes understanding and agrees with plan.    Final Clinical Impressions(s) / ED Diagnoses   Final diagnoses:  Chest wall pain   Discussed as above. Low risk, atypical CP. No concerning family history.   ED Discharge Orders    None       Renne CriglerGeiple, Langston [place], Cordelia Poche-C 10/31/17 1309    Gwyneth SproutPlunkett, Whitney, MD 11/04/17 1440

## 2017-10-31 NOTE — Discharge Instructions (Signed)
Please read and follow all provided instructions.  Your diagnoses today include:  1. Chest wall pain     Tests performed today include:  An EKG of your heart  A chest x-ray  Vital signs. See below for your results today.   Medications prescribed:   Robaxin (methocarbamol) - muscle relaxer medication  DO NOT drive or perform any activities that require you to be awake and alert because this medicine can make you drowsy.   Take any prescribed medications only as directed.  Follow-up instructions: Please follow-up with your primary care provider for further evaluation of your symptoms.   Return instructions:  SEEK IMMEDIATE MEDICAL ATTENTION IF:  You have severe chest pain, especially if the pain is crushing or pressure-like and spreads to the arms, back, neck, or jaw, or if you have sweating, nausea (feeling sick to your stomach), or shortness of breath. THIS IS AN EMERGENCY. Don't wait to see if the pain will go away. Get medical help at once. Call 911 or 0 (operator). DO NOT drive yourself to the hospital.   Your chest pain gets worse and does not go away with rest.   You have an attack of chest pain lasting longer than usual, despite rest and treatment with the medications your caregiver has prescribed.   You wake from sleep with chest pain or shortness of breath.  You feel dizzy or faint.  You have chest pain not typical of your usual pain for which you originally saw your caregiver.   You have any other emergent concerns regarding your health.  Additional Information: Chest pain comes from many different causes. Your caregiver has diagnosed you as having chest pain that is not specific for one problem, but does not require admission.  You are at low risk for an acute heart condition or other serious illness.   Your vital signs today were: BP (!) 155/98 (BP Location: Right Arm)    Pulse 81    Temp 98.5 F (36.9 C) (Oral)    Resp 18    Ht 6' (1.829 m)    Wt 122.5 kg  (270 lb)    SpO2 100%    BMI 36.62 kg/m  If your blood pressure (BP) was elevated above 135/85 this visit, please have this repeated by your doctor within one month. --------------

## 2017-10-31 NOTE — ED Notes (Signed)
Patient transported to X-ray 

## 2017-10-31 NOTE — ED Triage Notes (Addendum)
Left sided chest pain for two weeks.  Pt builds buses for a living.  Pt states he was playing around with his kids yesterday. Pt states he also feels sob as in not able to catch his breath intermittently.  NAD noted.  No respiratory distress noted.  Pt states he has recently traveled x 2 occurrences.  Some thigh pain.

## 2018-01-12 DIAGNOSIS — Z139 Encounter for screening, unspecified: Secondary | ICD-10-CM | POA: Insufficient documentation

## 2018-02-09 DIAGNOSIS — Z7689 Persons encountering health services in other specified circumstances: Secondary | ICD-10-CM | POA: Insufficient documentation

## 2018-03-09 DIAGNOSIS — S61402A Unspecified open wound of left hand, initial encounter: Secondary | ICD-10-CM | POA: Insufficient documentation

## 2019-07-08 ENCOUNTER — Other Ambulatory Visit: Payer: Self-pay

## 2019-07-08 ENCOUNTER — Encounter (HOSPITAL_COMMUNITY): Payer: Self-pay | Admitting: Emergency Medicine

## 2019-07-08 ENCOUNTER — Inpatient Hospital Stay (HOSPITAL_COMMUNITY)
Admission: EM | Admit: 2019-07-08 | Discharge: 2019-07-11 | DRG: 392 | Disposition: A | Discharge status: Home / Self Care | Payer: BLUE CROSS/BLUE SHIELD | Attending: Family Medicine | Admitting: Family Medicine

## 2019-07-08 ENCOUNTER — Emergency Department (HOSPITAL_COMMUNITY): Payer: BLUE CROSS/BLUE SHIELD

## 2019-07-08 DIAGNOSIS — R739 Hyperglycemia, unspecified: Secondary | ICD-10-CM

## 2019-07-08 DIAGNOSIS — K5732 Diverticulitis of large intestine without perforation or abscess without bleeding: Secondary | ICD-10-CM | POA: Diagnosis not present

## 2019-07-08 DIAGNOSIS — E1165 Type 2 diabetes mellitus with hyperglycemia: Secondary | ICD-10-CM

## 2019-07-08 DIAGNOSIS — R1031 Right lower quadrant pain: Secondary | ICD-10-CM | POA: Diagnosis not present

## 2019-07-08 DIAGNOSIS — K5792 Diverticulitis of intestine, part unspecified, without perforation or abscess without bleeding: Secondary | ICD-10-CM

## 2019-07-08 DIAGNOSIS — Z886 Allergy status to analgesic agent status: Secondary | ICD-10-CM

## 2019-07-08 DIAGNOSIS — Z20822 Contact with and (suspected) exposure to covid-19: Secondary | ICD-10-CM | POA: Diagnosis present

## 2019-07-08 DIAGNOSIS — E669 Obesity, unspecified: Secondary | ICD-10-CM | POA: Diagnosis present

## 2019-07-08 DIAGNOSIS — E876 Hypokalemia: Secondary | ICD-10-CM | POA: Diagnosis not present

## 2019-07-08 DIAGNOSIS — R030 Elevated blood-pressure reading, without diagnosis of hypertension: Secondary | ICD-10-CM

## 2019-07-08 DIAGNOSIS — Z8249 Family history of ischemic heart disease and other diseases of the circulatory system: Secondary | ICD-10-CM

## 2019-07-08 DIAGNOSIS — Z833 Family history of diabetes mellitus: Secondary | ICD-10-CM

## 2019-07-08 DIAGNOSIS — Z6839 Body mass index (BMI) 39.0-39.9, adult: Secondary | ICD-10-CM

## 2019-07-08 LAB — COMPREHENSIVE METABOLIC PANEL
ALT: 28 U/L (ref 0–44)
AST: 23 U/L (ref 15–41)
Albumin: 4.2 g/dL (ref 3.5–5.0)
Alkaline Phosphatase: 89 U/L (ref 38–126)
Anion gap: 14 (ref 5–15)
BUN: 8 mg/dL (ref 6–20)
CO2: 20 mmol/L — ABNORMAL LOW (ref 22–32)
Calcium: 9.8 mg/dL (ref 8.9–10.3)
Chloride: 102 mmol/L (ref 98–111)
Creatinine, Ser: 0.88 mg/dL (ref 0.61–1.24)
GFR calc Af Amer: >60 mL/min (ref >60)
GFR calc non Af Amer: >60 mL/min (ref >60)
Glucose, Bld: 207 mg/dL — ABNORMAL HIGH (ref 70–99)
Potassium: 4.1 mmol/L (ref 3.5–5.1)
Sodium: 136 mmol/L (ref 135–145)
Total Bilirubin: 0.9 mg/dL (ref 0.3–1.2)
Total Protein: 7.6 g/dL (ref 6.5–8.1)

## 2019-07-08 LAB — URINALYSIS, ROUTINE W REFLEX MICROSCOPIC
Bacteria, UA: NONE SEEN
Bilirubin Urine: NEGATIVE
Glucose, UA: >=500 mg/dL — AB
Ketones, ur: NEGATIVE mg/dL
Leukocytes,Ua: NEGATIVE
Nitrite: NEGATIVE
Protein, ur: 30 mg/dL — AB
Specific Gravity, Urine: 1.023 (ref 1.005–1.030)
pH: 7 (ref 5.0–8.0)

## 2019-07-08 LAB — SARS CORONAVIRUS 2 (TAT 6-24 HRS): SARS Coronavirus 2: NEGATIVE

## 2019-07-08 LAB — CBC
HCT: 41.4 % (ref 39.0–52.0)
Hemoglobin: 13.6 g/dL (ref 13.0–17.0)
MCH: 29 pg (ref 26.0–34.0)
MCHC: 32.9 g/dL (ref 30.0–36.0)
MCV: 88.3 fL (ref 80.0–100.0)
Platelets: 436 10*3/uL — ABNORMAL HIGH (ref 150–400)
RBC: 4.69 MIL/uL (ref 4.22–5.81)
RDW: 13.1 % (ref 11.5–15.5)
WBC: 14.3 10*3/uL — ABNORMAL HIGH (ref 4.0–10.5)
nRBC: 0 % (ref 0.0–0.2)

## 2019-07-08 LAB — GLUCOSE, CAPILLARY
Glucose-Capillary: 115 mg/dL — ABNORMAL HIGH (ref 70–99)
Glucose-Capillary: 162 mg/dL — ABNORMAL HIGH (ref 70–99)

## 2019-07-08 LAB — LIPASE, BLOOD: Lipase: 28 U/L (ref 11–51)

## 2019-07-08 MED ORDER — MORPHINE SULFATE (PF) 4 MG/ML IV SOLN
4.0000 mg | Freq: Once | INTRAVENOUS | Status: AC
  Administered 2019-07-08: 4 mg via INTRAVENOUS
  Filled 2019-07-08: qty 1

## 2019-07-08 MED ORDER — METRONIDAZOLE IN NACL 5-0.79 MG/ML-% IV SOLN
500.0000 mg | Freq: Once | INTRAVENOUS | Status: AC
Start: 2019-07-08 — End: 2019-07-08
  Administered 2019-07-08: 500 mg via INTRAVENOUS
  Filled 2019-07-08 (×2): qty 100

## 2019-07-08 MED ORDER — SODIUM CHLORIDE 0.9 % IV SOLN
2.0000 g | INTRAVENOUS | Status: DC
  Administered 2019-07-09 – 2019-07-10 (×2): 2 g via INTRAVENOUS
  Filled 2019-07-08 (×2): qty 2
  Filled 2019-07-08: qty 20

## 2019-07-08 MED ORDER — HYDROMORPHONE HCL 1 MG/ML IJ SOLN
1.0000 mg | INTRAMUSCULAR | Status: DC | PRN
  Administered 2019-07-08 – 2019-07-11 (×14): 1 mg via INTRAVENOUS
  Filled 2019-07-08 (×14): qty 1

## 2019-07-08 MED ORDER — METRONIDAZOLE IN NACL 5-0.79 MG/ML-% IV SOLN
500.0000 mg | Freq: Three times a day (TID) | INTRAVENOUS | Status: DC
  Administered 2019-07-08 – 2019-07-11 (×8): 500 mg via INTRAVENOUS
  Filled 2019-07-08 (×8): qty 100

## 2019-07-08 MED ORDER — SODIUM CHLORIDE 0.9 % IV BOLUS
1000.0000 mL | Freq: Once | INTRAVENOUS | Status: AC
  Administered 2019-07-08: 1000 mL via INTRAVENOUS

## 2019-07-08 MED ORDER — ONDANSETRON HCL 4 MG/2ML IJ SOLN: 4.0000 mg | Freq: Four times a day (QID) | INTRAMUSCULAR | Status: DC | PRN

## 2019-07-08 MED ORDER — ONDANSETRON HCL 4 MG PO TABS: 4.0000 mg | ORAL_TABLET | Freq: Four times a day (QID) | ORAL | Status: DC | PRN

## 2019-07-08 MED ORDER — ACETAMINOPHEN 325 MG PO TABS
650.0000 mg | ORAL_TABLET | Freq: Four times a day (QID) | ORAL | Status: DC | PRN
  Administered 2019-07-08: 650 mg via ORAL
  Filled 2019-07-08: qty 2

## 2019-07-08 MED ORDER — SODIUM CHLORIDE 0.9 % IV SOLN: INTRAVENOUS | Status: DC

## 2019-07-08 MED ORDER — ENOXAPARIN SODIUM 40 MG/0.4ML ~~LOC~~ SOLN
40.0000 mg | SUBCUTANEOUS | Status: DC
  Administered 2019-07-08: 40 mg via SUBCUTANEOUS
  Filled 2019-07-08: qty 0.4

## 2019-07-08 MED ORDER — ONDANSETRON HCL 4 MG/2ML IJ SOLN
4.0000 mg | Freq: Once | INTRAMUSCULAR | Status: AC
  Administered 2019-07-08: 4 mg via INTRAVENOUS
  Filled 2019-07-08: qty 2

## 2019-07-08 MED ORDER — SODIUM CHLORIDE 0.9% FLUSH
3.0000 mL | Freq: Once | INTRAVENOUS | Status: AC
  Administered 2019-07-08: 3 mL via INTRAVENOUS

## 2019-07-08 MED ORDER — CEFTRIAXONE SODIUM 2 G IJ SOLR
2.0000 g | Freq: Once | INTRAMUSCULAR | Status: AC
  Administered 2019-07-08: 2 g via INTRAVENOUS
  Filled 2019-07-08: qty 20

## 2019-07-08 MED ORDER — IOHEXOL 300 MG/ML  SOLN
100.0000 mL | Freq: Once | INTRAMUSCULAR | Status: AC | PRN
  Administered 2019-07-08: 100 mL via INTRAVENOUS

## 2019-07-08 NOTE — ED Triage Notes (Signed)
C/o generalized abd pain/cramping, nausea, vomiting, and diarrhea since Friday.

## 2019-07-08 NOTE — ED Provider Notes (Signed)
[place] EMERGENCY DEPARTMENT Provider Note   CSN: [id number] Arrival date & time: 07/08/19  0848     History Chief Complaint  Patient presents with  . Abdominal Pain    Rodney Rush is a 29 y.o. male.  Pt presents to the ED today with abdominal pain and n/v.  Pt said sx started on 4/9.  He has never had anything like this in the past.  Pain is diffuse.  No fevers, but he has had some sweats and "felt hot."        History reviewed. No pertinent past medical history.  There are no problems to display for this patient.   Past Surgical History:  Procedure Laterality Date  . APPENDECTOMY         No family history on file.  Social History   Tobacco Use  . Smoking status: Never Smoker  . Smokeless tobacco: Never Used  Substance Use Topics  . Alcohol use: No  . Drug use: No    Home Medications Prior to Admission medications   Medication Sig Start Date End Date Taking? Authorizing Provider  methocarbamol (ROBAXIN) 500 MG tablet Take 2 tablets (1,000 mg total) by mouth 4 (four) times daily. 10/31/17   Carlisle Cater, PA-C    Allergies    Ibuprofen  Review of Systems   Review of Systems  Gastrointestinal: Positive for abdominal pain, diarrhea, nausea and vomiting.  All other systems reviewed and are negative.   Physical Exam Updated Vital Signs BP 140/82   Pulse 92   Temp 97.7 F (36.5 C) (Oral)   Resp 18   Ht 6' (1.829 m)   Wt 131.5 kg   SpO2 100%   BMI 39.33 kg/m   Physical Exam Vitals and nursing note reviewed.  Constitutional:      Appearance: He is well-developed. He is obese.  HENT:     Head: Normocephalic and atraumatic.     Mouth/Throat:     Mouth: Mucous membranes are moist.     Pharynx: Oropharynx is clear.  Eyes:     Extraocular Movements: Extraocular movements intact.     Pupils: Pupils are equal, round, and reactive to light.  Cardiovascular:     Rate and Rhythm: Normal rate and regular rhythm.    Pulmonary:     Effort: Pulmonary effort is normal.     Breath sounds: Normal breath sounds.  Abdominal:     General: Abdomen is flat. Bowel sounds are normal.     Palpations: Abdomen is soft.     Tenderness: There is generalized abdominal tenderness.  Skin:    General: Skin is warm.     Capillary Refill: Capillary refill takes less than 2 seconds.  Neurological:     General: No focal deficit present.     Mental Status: He is alert and oriented to person, place, and time.  Psychiatric:        Mood and Affect: Mood normal.        Behavior: Behavior normal.     ED Results / Procedures / Treatments   Labs (all labs ordered are listed, but only abnormal results are displayed) Labs Reviewed  COMPREHENSIVE METABOLIC PANEL - Abnormal; Notable for the following components:      Result Value   CO2 20 (*)    Glucose, Bld 207 (*)    All other components within normal limits  CBC - Abnormal; Notable for the following components:   WBC 14.3 (*)    Platelets  436 (*)    All other components within normal limits  URINALYSIS, ROUTINE W REFLEX MICROSCOPIC - Abnormal; Notable for the following components:   Glucose, UA >=500 (*)    Hgb urine dipstick SMALL (*)    Protein, ur 30 (*)    All other components within normal limits  SARS CORONAVIRUS 2 (TAT 6-24 HRS)  LIPASE, BLOOD  HEMOGLOBIN A1C    EKG None  Radiology CT ABDOMEN PELVIS W CONTRAST  Result Date: 07/08/2019 CLINICAL DATA:  Generalized abdominal pain. EXAM: CT ABDOMEN AND PELVIS WITH CONTRAST TECHNIQUE: Multidetector CT imaging of the abdomen and pelvis was performed using the standard protocol following bolus administration of intravenous contrast. CONTRAST:  OMNIPAQUE IOHEXOL 300 MG/ML  SOLN COMPARISON:  November 09, 2008 FINDINGS: Lower chest: No acute abnormality. Hepatobiliary: No focal liver abnormality is seen. No gallstones, gallbladder wall thickening, or biliary dilatation. Pancreas: Unremarkable. No pancreatic  ductal dilatation or surrounding inflammatory changes. Spleen: Normal in size without focal abnormality. Adrenals/Urinary Tract: Adrenal glands are unremarkable. Kidneys are normal, without renal calculi, focal lesion, or hydronephrosis. Bladder is unremarkable. Stomach/Bowel: The stomach and small bowel are normal. There is fat stranding adjacent to a thickened loop of sigmoid colon. There are a few diverticuli in this location. There is a small focus of extraluminal gas as seen on axial image 76 and coronal image 93 measuring up to 1.2 cm. No associated fluid in the region of the gas. The remainder of the colon is normal. The patient appears to be status post appendectomy. Vascular/Lymphatic: No significant vascular findings are present. No enlarged abdominal or pelvic lymph nodes. Reproductive: Prostate is unremarkable. Other: No abdominal wall hernia or abnormality. No abdominopelvic ascites. Musculoskeletal: No acute or significant osseous findings. IMPRESSION: 1. Fat stranding adjacent to a thickened loop of sigmoid colon with scattered diverticuli in this region is consistent with diverticulitis. A 1.2 cm focus of extraluminal gas is identified consistent with contained micro perforation. No abscess noted. 2. No other acute abnormalities. Electronically Signed   By: Gerome Sam III M.D   On: 07/08/2019 12:50    Procedures Procedures (including critical care time)  Medications Ordered in ED Medications  sodium chloride flush (NS) 0.9 % injection 3 mL (has no administration in time range)  cefTRIAXone (ROCEPHIN) 2 g in sodium chloride 0.9 % 100 mL IVPB (2 g Intravenous New Bag/Given 07/08/19 1320)    And  metroNIDAZOLE (FLAGYL) IVPB 500 mg (has no administration in time range)  sodium chloride 0.9 % bolus 1,000 mL (0 mLs Intravenous Stopped 07/08/19 1100)  morphine 4 MG/ML injection 4 mg (4 mg Intravenous Given 07/08/19 0950)  ondansetron (ZOFRAN) injection 4 mg (4 mg Intravenous Given 07/08/19  0950)  iohexol (OMNIPAQUE) 300 MG/ML solution 100 mL (100 mLs Intravenous Contrast Given 07/08/19 1159)  morphine 4 MG/ML injection 4 mg (4 mg Intravenous Given 07/08/19 1233)    ED Course  I have reviewed the triage vital signs and the nursing notes.  Pertinent labs & imaging results that were available during my care of the patient were reviewed by me and considered in my medical decision making (see chart for details).    MDM Rules/Calculators/A&P                     Pt does have diverticulitis on CT.  He is given IV rocephin and flagyl.  He is still uncomfortable after treatment with morphine, so I think he needs to stay overnight.  Pt's bs is  also elevated.  He is likely a new onset type 2 diabetic.   Pt d/w FP residents for admission.  Final Clinical Impression(s) / ED Diagnoses Final diagnoses:  Diverticulitis  Hyperglycemia    Rx / DC Orders ED Discharge Orders    None       Jacalyn Lefevre, MD 07/08/19 1331

## 2019-07-08 NOTE — H&P (Addendum)
Family Medicine Teaching [facility] Admission History and Physical Service Pager: [phone]  Patient name: Rodney Rush Medical record number: [id number] Date of birth: [birth year] Age: 29 y.o. Gender: male  Primary Care Provider: Patient, No Pcp Per Consultants: None Code Status: Full  Preferred Emergency Contact: Wife, Doyle Kunath , [phone]  Chief Complaint: Abdominal pain  Assessment and Plan: Rodney Rush is a 29 y.o. male presenting with abdominal pain and emesis, found to have acute diverticulitis on CT. Past medical history significiant for seasonal allergies only.   Acute Uncomplicated Diverticulitis with microperforation:  Patient presents with abdominal pain for going on 3 days accompanied with vomiting. Abdominal CT shows fat stranding adjacent to a thickened loop of sigmoid colon with scattered diverticuli in this region is consistent with diverticulitis. A 1.2 cm focus of extraluminal gas is identified consistent with contained micro perforation. No abscess.  On exam, patient is especially tender right lower quadrant and is visibly uncomfortable with any movement.  Otherwise, patient has bowel sounds and abdomen is soft.  White blood cell count elevated at 14.3.  Patient is afebrile with elevated blood pressure 156/87. Considered HHS/DKA as etiology of vomiting/abdominal pain due to elevated glucose, however clinical presentation more suggestive of above.  Lipase WNL, patient also has history of appendectomy. U/A non-infectious appearing. On admit patient with first-time confirmed uncomplicated diverticulitis and microperforation, requires admission for IV antibiotics and pain control with IV fluids in the setting of inability to tolerate oral fluid intake. -admit to med surg, attending Dr. Lum Babe  -Ceftriaxone (4/11- ), plan for 10-day course -Flagyl IV 500 mg every 8 hours -vitals per floor protocol  -NS IV fluids at 150 mL/h -Clear liquid diet as  tolerated -Dilaudid 1 mg every 3 hours as needed -Zofran 4 mg as needed -Up as tolerated -HIV -A.m. CBC -A.m. BMP   New Onset Diabetes, likely Type 2 BG on admission elevated at 207. Noted to have glucosuria, 500 on U/A, without ketonuria.   Suspect this is likely newly diagnosed T2DM with random CBG  >200 and recent endorsement of polyuria/polydipsia, however will confirm with A1c as he may additionally have stress-induced hyperglycemia given patient presents in acute illness. No concern for HHS at this time. Patient reports family history of diabetes in his father, no other family members known to have diabetes.  Patient's obesity and lifestyle are risk factors for type 2 diabetes. -Hemoglobin A1c -CBG checks q4 for now, may discontinue if consistently <180  -A.m. BMP -Consider adding sensitive sliding scale insulin depending on next 24 hours CBG range -C-peptide  Elevated blood pressure: Stable. No previous diagnosis of hypertension. SBP ranging 140-160 since arrival.  Likely in the setting of pain, will monitor as his clinical status improves especially with risk factors including elevated BMI (39) and likely T2DM as discussed above. -Monitor BP    FEN/GI: clear liquids as tolerated  Prophylaxis: Lovenox   Disposition: admit to med surg, attending Dr Lum Babe   History of Present Illness:  Rodney Rush is a 29 y.o. male presenting with abdominal pain that started on Friday night.   Patient reports that he ate food from [employer] two days ago and shortly afterwards his stomach began to hurt.  He thought it was from the food but his pain kept getting worse.  Yesterday, he had more severe pain and began to have vomiting, reporting 11-12 episodes overnight.  He tried to drink ginger ale and water without relief from the pain and ended up vomiting  it back up. Emesis appeared like the food he recently ate, then clear/yellow liquid as time passed. Emesis was NBNB.  Patient reports his  abdominal pain feels like pressure, aching and sharp. At first there was pain in the whole abdomen, now pain is predominately in the bilateral lower abdomen quadrants.  He endorses subjective fever with feeling of  warmth and cold sweats.  He denies dysuria, he endorses some NB diarrhea, but not as profuse as vomiting.  Does have some nausea.  Patient reports that he has been urinating more frequently recently (except when he got sick over this weekend as he has been urinating less due to not being able to tolerate oral intake) and he also endorses increased thirst prior to this onset of illness.  Labs were notable for elevated glucose of 207 and elevated white blood cell count of 14.3. Patient denies any prior history of diverticulitis.   ED course included 8 mg of morphine, Zofran 4mg  and he was started on CTX and Flagyll for abdominal CT findings of fat stranding adjacent to a thickened loop of sigmoid colon with scattered diverticuli and a 1.2 cm focus of extraluminal gas  consistent with contained micro perforation. No abscess noted.   Review Of Systems: Per HPI with the following additions:   Review of Systems  Constitutional: Positive for chills and fever.  Respiratory: Positive for cough. Negative for sputum production and shortness of breath.   Cardiovascular: Negative for chest pain and palpitations.  Gastrointestinal: Positive for abdominal pain, diarrhea, nausea and vomiting. Negative for blood in stool and melena.  Genitourinary: Negative for dysuria.  Neurological: Negative for dizziness and weakness.    Past Medical History: History reviewed. No pertinent past medical history.  Past Surgical History: Past Surgical History:  Procedure Laterality Date  . APPENDECTOMY      Social History: Social History   Tobacco Use  . Smoking status: Never Smoker  . Smokeless tobacco: Never Used  Substance Use Topics  . Alcohol use: No  . Drug use: No   Additional social history:  Married, non-smoker, does not drink alcohol on regular basis.   Family History: Family History  Problem Relation Age of Onset  . Constipation Mother   . Diabetes Father   . Heart attack Father       Allergies and Medications: Allergies  Allergen Reactions  . Ibuprofen Hives   No current facility-administered medications on file prior to encounter.   No current outpatient medications on file prior to encounter.    Objective: BP (!) 156/87 (BP Location: Right Arm)   Pulse 86   Temp 98.6 F (37 C)   Resp 18   Ht 6' (1.829 m)   Wt 131.5 kg   SpO2 99%   BMI 39.33 kg/m   Exam: General: Obese male sitting up in bed in hospital gown visibly in pain Eyes: No scleral icterus or conjunctival injection, extraocular muscles intact bilaterally Neck:  acanthosis nigricans on posterior aspect of neck  Cardiovascular: RRR, no murmurs or gallops appreciated, radial pulses palpable  Respiratory: Clear to auscultation bilaterally, no wheezing, no respiratory distress, stable on room air, no crackles Gastrointestinal: Obese abdomen, slightly distended, bowel sounds present, tenderness to palpation greater in right lower quadrant than left, no rebound tenderness, some voluntary guarding MSK: Moves extremities with normal range of motion Derm: No lesions or ulcerations noted Neuro: Alert and oriented x4 Psych: Appropriate responses to questions, normal rate of speech, patient in hospital gown and conversational and cooperates  with exam  Labs and Imaging: CBC BMET  Recent Labs  Lab 07/08/19 0917  WBC 14.3*  HGB 13.6  HCT 41.4  PLT 436*   Recent Labs  Lab 07/08/19 0917  NA 136  K 4.1  CL 102  CO2 20*  BUN 8  CREATININE 0.88  GLUCOSE 207*  CALCIUM 9.8     EKG: none completed   CT ABDOMEN PELVIS W CONTRAST  Result Date: 07/08/2019 CLINICAL DATA:  Generalized abdominal pain. EXAM: CT ABDOMEN AND PELVIS WITH CONTRAST TECHNIQUE: Multidetector CT imaging of the abdomen and  pelvis was performed using the standard protocol following bolus administration of intravenous contrast. CONTRAST:  13mL OMNIPAQUE IOHEXOL 300 MG/ML  SOLN COMPARISON:  November 09, 2008 FINDINGS: Lower chest: No acute abnormality. Hepatobiliary: No focal liver abnormality is seen. No gallstones, gallbladder wall thickening, or biliary dilatation. Pancreas: Unremarkable. No pancreatic ductal dilatation or surrounding inflammatory changes. Spleen: Normal in size without focal abnormality. Adrenals/Urinary Tract: Adrenal glands are unremarkable. Kidneys are normal, without renal calculi, focal lesion, or hydronephrosis. Bladder is unremarkable. Stomach/Bowel: The stomach and small bowel are normal. There is fat stranding adjacent to a thickened loop of sigmoid colon. There are a few diverticuli in this location. There is a small focus of extraluminal gas as seen on axial image 76 and coronal image 93 measuring up to 1.2 cm. No associated fluid in the region of the gas. The remainder of the colon is normal. The patient appears to be status post appendectomy. Vascular/Lymphatic: No significant vascular findings are present. No enlarged abdominal or pelvic lymph nodes. Reproductive: Prostate is unremarkable. Other: No abdominal wall hernia or abnormality. No abdominopelvic ascites. Musculoskeletal: No acute or significant osseous findings. IMPRESSION: 1. Fat stranding adjacent to a thickened loop of sigmoid colon with scattered diverticuli in this region is consistent with diverticulitis. A 1.2 cm focus of extraluminal gas is identified consistent with contained micro perforation. No abscess noted. 2. No other acute abnormalities. Electronically Signed   By: Dorise Bullion III M.D   On: 07/08/2019 12:50     Patriciaann Clan, DO 07/08/2019, 7:01 PM PGY-1, [place] Intern pager: [phone], text pages welcome  FPTS Upper-Level Resident Addendum   I have independently interviewed and examined  the patient. I have discussed the above with the original author and agree with their documentation. My edits for correction/addition/clarification are in green. Please see also any attending notes.    Patriciaann Clan, DO  Family Medicine PGY-2

## 2019-07-09 DIAGNOSIS — R030 Elevated blood-pressure reading, without diagnosis of hypertension: Secondary | ICD-10-CM | POA: Diagnosis present

## 2019-07-09 DIAGNOSIS — E876 Hypokalemia: Secondary | ICD-10-CM | POA: Diagnosis not present

## 2019-07-09 DIAGNOSIS — E669 Obesity, unspecified: Secondary | ICD-10-CM | POA: Diagnosis present

## 2019-07-09 DIAGNOSIS — K5792 Diverticulitis of intestine, part unspecified, without perforation or abscess without bleeding: Secondary | ICD-10-CM | POA: Diagnosis present

## 2019-07-09 DIAGNOSIS — E1165 Type 2 diabetes mellitus with hyperglycemia: Secondary | ICD-10-CM | POA: Diagnosis present

## 2019-07-09 DIAGNOSIS — Z886 Allergy status to analgesic agent status: Secondary | ICD-10-CM | POA: Diagnosis not present

## 2019-07-09 DIAGNOSIS — K5732 Diverticulitis of large intestine without perforation or abscess without bleeding: Secondary | ICD-10-CM | POA: Diagnosis present

## 2019-07-09 DIAGNOSIS — Z20822 Contact with and (suspected) exposure to covid-19: Secondary | ICD-10-CM | POA: Diagnosis present

## 2019-07-09 DIAGNOSIS — Z8249 Family history of ischemic heart disease and other diseases of the circulatory system: Secondary | ICD-10-CM | POA: Diagnosis not present

## 2019-07-09 DIAGNOSIS — Z6839 Body mass index (BMI) 39.0-39.9, adult: Secondary | ICD-10-CM | POA: Diagnosis not present

## 2019-07-09 DIAGNOSIS — Z833 Family history of diabetes mellitus: Secondary | ICD-10-CM | POA: Diagnosis not present

## 2019-07-09 DIAGNOSIS — R739 Hyperglycemia, unspecified: Secondary | ICD-10-CM | POA: Diagnosis not present

## 2019-07-09 LAB — HEMOGLOBIN A1C
Hgb A1c MFr Bld: 7.7 % — ABNORMAL HIGH (ref 4.8–5.6)
Mean Plasma Glucose: 174 mg/dL

## 2019-07-09 LAB — BASIC METABOLIC PANEL
Anion gap: 12 (ref 5–15)
BUN: 10 mg/dL (ref 6–20)
CO2: 25 mmol/L (ref 22–32)
Calcium: 9 mg/dL (ref 8.9–10.3)
Chloride: 100 mmol/L (ref 98–111)
Creatinine, Ser: 0.92 mg/dL (ref 0.61–1.24)
GFR calc Af Amer: >60 mL/min (ref >60)
GFR calc non Af Amer: >60 mL/min (ref >60)
Glucose, Bld: 145 mg/dL — ABNORMAL HIGH (ref 70–99)
Potassium: 3.8 mmol/L (ref 3.5–5.1)
Sodium: 137 mmol/L (ref 135–145)

## 2019-07-09 LAB — CBC
HCT: 38.4 % — ABNORMAL LOW (ref 39.0–52.0)
Hemoglobin: 12.8 g/dL — ABNORMAL LOW (ref 13.0–17.0)
MCH: 28.7 pg (ref 26.0–34.0)
MCHC: 33.3 g/dL (ref 30.0–36.0)
MCV: 86.1 fL (ref 80.0–100.0)
Platelets: 358 10*3/uL (ref 150–400)
RBC: 4.46 MIL/uL (ref 4.22–5.81)
RDW: 13.2 % (ref 11.5–15.5)
WBC: 16.8 10*3/uL — ABNORMAL HIGH (ref 4.0–10.5)
nRBC: 0 % (ref 0.0–0.2)

## 2019-07-09 LAB — HIV ANTIBODY (ROUTINE TESTING W REFLEX): HIV Screen 4th Generation wRfx: NONREACTIVE

## 2019-07-09 LAB — GLUCOSE, CAPILLARY
Glucose-Capillary: 129 mg/dL — ABNORMAL HIGH (ref 70–99)
Glucose-Capillary: 117 mg/dL — ABNORMAL HIGH (ref 70–99)
Glucose-Capillary: 92 mg/dL (ref 70–99)

## 2019-07-09 MED ORDER — AMLODIPINE BESYLATE 5 MG PO TABS
5.0000 mg | ORAL_TABLET | Freq: Every day | ORAL | Status: DC
  Administered 2019-07-09 – 2019-07-10 (×2): 5 mg via ORAL
  Filled 2019-07-09 (×2): qty 1

## 2019-07-09 MED ORDER — ACETAMINOPHEN 500 MG PO TABS
1000.0000 mg | ORAL_TABLET | Freq: Four times a day (QID) | ORAL | Status: DC
  Administered 2019-07-09 – 2019-07-11 (×9): 1000 mg via ORAL
  Filled 2019-07-09 (×9): qty 2

## 2019-07-09 MED ORDER — ENOXAPARIN SODIUM 80 MG/0.8ML ~~LOC~~ SOLN
0.5000 mg/kg | SUBCUTANEOUS | Status: DC
  Administered 2019-07-09 – 2019-07-11 (×3): 65 mg via SUBCUTANEOUS
  Filled 2019-07-09 (×3): qty 0.8

## 2019-07-09 NOTE — Progress Notes (Addendum)
Family Medicine Teaching Service Daily Progress Note Intern Pager: [phone]  Patient name: Rodney Rush Medical record number: [id number] Date of birth: 1991-03-04 Age: 29 y.o. Gender: male  Primary Care Provider: Patient, No Pcp Per Consultants: none Code Status: full code  Pt Overview and Major Events to Date:  4/11: Admitted, Flagyl and ceftriaxone started 4/12: amlodipine started 4/13: captopril started   Assessment and Plan: Rodney Rush is a 30 y.o. male presenting with abdominal pain and emesis, found to have acute diverticulitis on CT. Past medical history significiant for seasonal allergies only.   Acute Uncomplicated Diverticulitis with microperforation:  Patient reports some improvement with abdominal pain that is no longer constant but only with movement.  Patient appears more comfortable on exam with still tenderness to palpation.  WBC improved from 16 to 13. No fever o/n. Denies subjective fevers or chills, minimal nausea.  -Ceftriaxone (4/11), plan for 10-day course total abx -Flagyl IV 500 mg every 8 hours -vitals per floor protocol  -Continue NS IV fluids at 150 mL/h -transition to full liquid diet as tolerated -Dilaudid 1 mg every 3 hours as needed -Zofran 4 mg as needed -Up as tolerated -Tylenol as needed -add miralax   New Onset Diabetes, likely Type 2 CBG overnight ranged from 92-112. Improved this AM to 145 on CMP. Do not think it is necessary for insulin therapy at this time.  Hemoglobin  A1c elevated at 7.7. c peptide WNL 3.8. -CBG checks at dinner  -monitor glucose with BMP -no need for sliding scale insulin at this time   -We will add Metformin at discharge  Hypokalemia Patient's potassium decreased to 3.3.  -Replenish potassium -Check magnesium -A.m. BMP   Elevated blood pressure: Stable. No previous diagnosis of hypertension. SBP overnight elevated and amlodipine 5mg  started. BP remains elevated at 176 systolic this AM. Does report a HA  this am but patient attributes this to hunger. Will start captor - dc amlodipine  - start captopril 12.5 BID  - Monitor BP   FEN/GI: full liquids as tolerated  Prophylaxis: Lovenox   Disposition: dc pending toleration of oral hydration and toleration of PO abx   Subjective:  Patient reports abd pain has improved and he feels the abx are working. Has minimal nausea when eating/drinking but no emesis.  Objective: Temp:  [98 F (36.7 C)-99.2 F (37.3 C)] 99.1 F (37.3 C) (04/13 1315) Pulse Rate:  [87-91] 87 (04/13 1315) Resp:  [16-19] 19 (04/13 1315) BP: (136-180)/(82-108) 156/100 (04/13 1315) SpO2:  [96 %-100 %] 98 % (04/13 1315)  Physical Exam: General: Obese male sitting in a bed visibly uncomfortable not much improvement from physical exam prior Cardiovascular: Regular rate and rhythm without murmurs Respiratory: Clear to auscultation bilaterally with no wheezing or increased work of breathing Abdomen: Obese abdomen, some distention, tenderness with placing stethoscope on abdomen Extremities: Moves extremities with normal range of motion, no pitting edema  Laboratory: Recent Labs  Lab 07/08/19 0917 07/09/19 0404 07/10/19 0623  WBC 14.3* 16.8* 13.4*  HGB 13.6 12.8* 12.7*  HCT 41.4 38.4* 38.2*  PLT 436* 358 330   Recent Labs  Lab 07/08/19 0917 07/09/19 0404 07/10/19 0623  NA 136 137 134*  K 4.1 3.8 3.3*  CL 102 100 99  CO2 20* 25 23  BUN 8 10 8   CREATININE 0.88 0.92 0.85  CALCIUM 9.8 9.0 8.9  PROT 7.6  --   --   BILITOT 0.9  --   --   ALKPHOS 89  --   --  ALT 28  --   --   AST 23  --   --   GLUCOSE 207* 145* 137*     Imaging/Diagnostic Tests: No results found.   Stark Klein, MD 07/10/2019, 1:17 PM PGY-1, [place] Intern pager: [phone], text pages welcome

## 2019-07-09 NOTE — Plan of Care (Signed)
  Problem: Education: Goal: Knowledge of General Education information will improve Description Including pain rating scale, medication(s)/side effects and non-pharmacologic comfort measures Outcome: Progressing   

## 2019-07-09 NOTE — Hospital Course (Addendum)
Rodney Rush is a 29 y.o. male presented with abdominal pain and emesis, found to have acute diverticulitis on CT. Past medical history significiant for seasonal allergies, newly diagnosed diabetes during this admission.   Acute, Uncomplicated Diverticulitis Patient presented with abdominal pain and emesis.  Notable labs included leukocytosis of 14.3.  He was found to have diverticulitis with microperforation and no abscess.  During this admission, patient's max temperature was 102.1 and managed with Tylenol. Patient was put on bowel rest and started on IV ceftriaxone and metronidazole on 4/11 and transitioned to oral antibiotics on 4/14 when he was able to tolerate oral liquid diet. Antibiotic regimen at the time of discharge included Augmentin and oral metronidazole.  Patient's pain was managed throughout admission.  Patient was instructed to complete antibiotic course on 07/17/2019.   Elevated blood pressure She was noted to have elevated blood pressures to max systolic of 180.  Patient was started on amlodipine 5 mg and transition to captopril 12.5 mg twice daily.    T2DM  Patient had 207 glucose on admission. C peptide was normal at 3.8. Patient's blood glucose levels were monitored throughout admission and ranged from 115-160. He was recommended to start Metformin at discharge.

## 2019-07-09 NOTE — Progress Notes (Signed)
Paged by RN with concern for elevated BP 180/104 at 1500.  RN reports patient has had pain medication after this and is still BP remains elevated.  No documentation of recent BP.  RN reports patient in bed and not symptomatic.   -Receck manual BP and will reassess.   -Night team aware.  Dana Allan MD PGY1

## 2019-07-09 NOTE — Progress Notes (Addendum)
Family Medicine Teaching Service Daily Progress Note Intern Pager: [phone]  Patient name: Rodney Rush Medical record number: [id number] Date of birth: Nov 06, 1990 Age: 29 y.o. Gender: male  Primary Care Provider: Patient, No Pcp Per Consultants: none Code Status: full code  Pt Overview and Major Events to Date:  4/11: Admitted, Flagyl and ceftriaxone started  Assessment and Plan: Rodney Rush is a 29 y.o. male presenting with abdominal pain and emesis, found to have acute diverticulitis on CT. Past medical history significiant for seasonal allergies only.   Acute Uncomplicated Diverticulitis with microperforation:  Patient reports to have severe abdominal pain and pain is worse with movement.  Reports that when he had the fever, he had on a lot of blankets and was not moving, denies feeling feverish or having cold chills.  White blood cell count increased from 14-16 today.On exam, continues to have lower abominal pain, positive bowel sounds. Patient required 3 doses of dilaudid overnight for pain control as well as tylenol. PO fluid. Noted to have fever of 102 overnight, resolved with Tylenol.   -Ceftriaxone (4/11), plan for 10-day course total abx -Flagyl IV 500 mg every 8 hours -vitals per floor protocol  -Continue NS IV fluids at 150 mL/h -Clear liquid diet as tolerated -Dilaudid 1 mg every 3 hours as needed -Consider adding Toradol 15 mg q 6 hours -Zofran 4 mg as needed -Up as tolerated -Tylenol as needed -We will consider repeat abdominal/pelvic CT if patient's clinical status does not improve overnight -if continues to be afebrile, will collect blood cultures   New Onset Diabetes, likely Type 2 CBG overnight ranged from 115-162. Improved this AM to 145 on CMP.  Hemoglobin  A1c elevated at 7.7.  C-peptide still pending. -CBG checks at dinner  -monitor glucose with BMP -will consider sensitive sliding scale insulin -f/u C-peptide  Elevated blood pressure:  Stable. No previous diagnosis of hypertension. SBP ranging 126-167/71-100 since arrival.  Likely in the setting of pain. Patient denies HA, blurry vision, chest pain.  Risk factors include obesity and new onset likely type 2 DM.  -Monitor BP    FEN/GI: clear liquids as tolerated  Prophylaxis: Lovenox   Disposition: dc pending improved clinical status and pain control   Subjective:  Patient reports continues to have severe abdominal pain that is responsive to Dilaudid however patient says the medication helps him sleep and is somewhat strong.  He reports that it hurts to move and denies feeling feverish or having cold chills.  Objective: Temp:  [98.4 F (36.9 C)-102.1 F (38.9 C)] 99.1 F (37.3 C) (04/12 0759) Pulse Rate:  [81-93] 87 (04/12 0759) Resp:  [18-20] 19 (04/12 0759) BP: (126-156)/(71-94) 151/92 (04/12 0759) SpO2:  [94 %-99 %] 98 % (04/12 0759)  Physical Exam: General: Obese male sitting in a bed visibly uncomfortable not much improvement from physical exam prior Cardiovascular: Regular rate and rhythm without murmurs Respiratory: Clear to auscultation bilaterally with no wheezing or increased work of breathing Abdomen: Obese abdomen, some distention, tenderness with placing stethoscope on abdomen Extremities: Moves extremities with normal range of motion, no pitting edema  Laboratory: Recent Labs  Lab 07/08/19 0917 07/09/19 0404  WBC 14.3* 16.8*  HGB 13.6 12.8*  HCT 41.4 38.4*  PLT 436* 358   Recent Labs  Lab 07/08/19 0917 07/09/19 0404  NA 136 137  K 4.1 3.8  CL 102 100  CO2 20* 25  BUN 8 10  CREATININE 0.88 0.92  CALCIUM 9.8 9.0  PROT 7.6  --  BILITOT 0.9  --   ALKPHOS 89  --   ALT 28  --   AST 23  --   GLUCOSE 207* 145*     Imaging/Diagnostic Tests: No results found.   Stark Klein, MD 07/09/2019, 2:38 PM PGY-1, [place] Intern pager: [phone], text pages welcome

## 2019-07-10 DIAGNOSIS — R030 Elevated blood-pressure reading, without diagnosis of hypertension: Secondary | ICD-10-CM

## 2019-07-10 DIAGNOSIS — K5792 Diverticulitis of intestine, part unspecified, without perforation or abscess without bleeding: Secondary | ICD-10-CM | POA: Diagnosis not present

## 2019-07-10 DIAGNOSIS — R739 Hyperglycemia, unspecified: Secondary | ICD-10-CM | POA: Diagnosis not present

## 2019-07-10 LAB — BASIC METABOLIC PANEL
Anion gap: 12 (ref 5–15)
BUN: 8 mg/dL (ref 6–20)
CO2: 23 mmol/L (ref 22–32)
Calcium: 8.9 mg/dL (ref 8.9–10.3)
Chloride: 99 mmol/L (ref 98–111)
Creatinine, Ser: 0.85 mg/dL (ref 0.61–1.24)
GFR calc Af Amer: >60 mL/min (ref >60)
GFR calc non Af Amer: >60 mL/min (ref >60)
Glucose, Bld: 137 mg/dL — ABNORMAL HIGH (ref 70–99)
Potassium: 3.3 mmol/L — ABNORMAL LOW (ref 3.5–5.1)
Sodium: 134 mmol/L — ABNORMAL LOW (ref 135–145)

## 2019-07-10 LAB — CBC
HCT: 38.2 % — ABNORMAL LOW (ref 39.0–52.0)
Hemoglobin: 12.7 g/dL — ABNORMAL LOW (ref 13.0–17.0)
MCH: 28.7 pg (ref 26.0–34.0)
MCHC: 33.2 g/dL (ref 30.0–36.0)
MCV: 86.2 fL (ref 80.0–100.0)
Platelets: 330 10*3/uL (ref 150–400)
RBC: 4.43 MIL/uL (ref 4.22–5.81)
RDW: 13 % (ref 11.5–15.5)
WBC: 13.4 10*3/uL — ABNORMAL HIGH (ref 4.0–10.5)
nRBC: 0 % (ref 0.0–0.2)

## 2019-07-10 LAB — C-PEPTIDE: C-Peptide: 3.8 ng/mL (ref 1.1–4.4)

## 2019-07-10 LAB — GLUCOSE, CAPILLARY: Glucose-Capillary: 129 mg/dL — ABNORMAL HIGH (ref 70–99)

## 2019-07-10 LAB — MAGNESIUM: Magnesium: 1.9 mg/dL (ref 1.7–2.4)

## 2019-07-10 MED ORDER — POTASSIUM CHLORIDE 10 MEQ/100ML IV SOLN
10.0000 meq | INTRAVENOUS | Status: AC
  Administered 2019-07-10 (×4): 10 meq via INTRAVENOUS
  Filled 2019-07-10 (×4): qty 100

## 2019-07-10 MED ORDER — CAPTOPRIL 12.5 MG PO TABS
12.5000 mg | ORAL_TABLET | Freq: Two times a day (BID) | ORAL | Status: DC
  Administered 2019-07-10 – 2019-07-11 (×2): 12.5 mg via ORAL
  Filled 2019-07-10 (×3): qty 1

## 2019-07-10 MED ORDER — POLYETHYLENE GLYCOL 3350 17 G PO PACK
17.0000 g | PACK | Freq: Two times a day (BID) | ORAL | Status: DC
  Administered 2019-07-10: 17 g via ORAL
  Filled 2019-07-10 (×3): qty 1

## 2019-07-10 NOTE — Plan of Care (Signed)
  Problem: Clinical Measurements: Goal: Ability to maintain clinical measurements within normal limits will improve Outcome: Progressing   Problem: Coping: Goal: Level of anxiety will decrease Outcome: Progressing   Problem: Pain Managment: Goal: General experience of comfort will improve Outcome: Progressing   

## 2019-07-10 NOTE — Discharge Summary (Signed)
Family Medicine Teaching [facility] Discharge Summary  Patient name: Rodney Rush Medical record number: [id number] Date of birth: [birth year] Age: 29 y.o. Gender: male Date of Admission: 07/08/2019  Date of Discharge: 07/11/19 Admitting Physician: Nicki Guadalajara, MD  Primary Care Provider: Patient, No Pcp Per Consultants: None, DM Edu  Indication for Hospitalization: diverticulitis   Discharge Diagnoses/Problem List:  Active Problems:   Diverticulitis   Elevated BP without diagnosis of hypertension   Type 2 diabetes mellitus with hyperglycemia, without long-term current use of insulin (HCC)  Disposition: discharge home   Discharge Condition: stable and improved   Discharge Exam:  General: Alert and cooperative and appears to be in no acute distress, sitting up in bed, more comfortable appearing and moving without showing signs of pain  Cardio: Normal S1 and S2, no S3 or S4. Rhythm is regular.  Pulm: Clear to auscultation bilaterally, no crackles, wheezing, or diminished breath sounds. Normal respiratory effort Abdomen: Bowel sounds normal. Abdomen soft, minimal tenderness in comparison to prior exam   Extremities: No peripheral edema. Warm and dry skin . Neuro: alert and oriented    Brief Hospital Course:  Rodney Rush is a 29 y.o. male presented with abdominal pain and emesis, found to have acute diverticulitis on CT. Past medical history significiant for seasonal allergies, newly diagnosed diabetes during this admission.   Acute, Uncomplicated Diverticulitis Patient presented with abdominal pain and emesis.  Notable labs included leukocytosis of 14.3.  He was found to have diverticulitis with microperforation and no abscess.  During this admission, patient's max temperature was 102.1 and managed with Tylenol. Patient was put on bowel rest and started on IV ceftriaxone and metronidazole on 4/11 and transitioned to oral antibiotics on 4/14 when he was able to tolerate  oral liquid diet. Antibiotic regimen at the time of discharge included Augmentin and oral metronidazole.  Patient's pain was managed throughout admission.  Patient was instructed to complete antibiotic course on 07/17/2019.   Elevated blood pressure She was noted to have elevated blood pressures to max systolic of 180.  Patient was started on amlodipine 5 mg and transition to captopril 12.5 mg twice daily.    T2DM  Patient had 207 glucose on admission. C peptide was normal at 3.8. Patient's blood glucose levels were monitored throughout admission and ranged from 115-160. He was recommended to start Metformin at discharge.    Issues for Follow Up:  1. Start Metformin at PCP follow up for DM as long as patient's abdominal pain has resolved. 2. Monitor BP. Patient started on captopril. Please increase as appropriate based on outpatient measurements.  3.  Patient should be set up for PCP: Nicki Guadalajara, MD; following access to care visit.   Significant Procedures: none   Significant Labs and Imaging:  Recent Labs  Lab 07/09/19 0404 07/10/19 0623 07/11/19 0500  WBC 16.8* 13.4* 10.7*  HGB 12.8* 12.7* 12.3*  HCT 38.4* 38.2* 36.5*  PLT 358 330 388   Recent Labs  Lab 07/08/19 0917 07/08/19 0917 07/09/19 0404 07/09/19 0404 07/10/19 0623 07/10/19 1210 07/11/19 0500  NA 136  --  137  --  134*  --  136  K 4.1   < > 3.8   < > 3.3*  --  3.3*  CL 102  --  100  --  99  --  101  CO2 20*  --  25  --  23  --  23  GLUCOSE 207*  --  145*  --  137*  --  127*  BUN 8  --  10  --  8  --  8  CREATININE 0.88  --  0.92  --  0.85  --  0.75  CALCIUM 9.8  --  9.0  --  8.9  --  8.9  MG  --   --   --   --   --  1.9  --   ALKPHOS 89  --   --   --   --   --   --   AST 23  --   --   --   --   --   --   ALT 28  --   --   --   --   --   --   ALBUMIN 4.2  --   --   --   --   --   --    < > = values in this interval not displayed.    CT ABDOMEN PELVIS W CONTRAST  Result Date: 07/08/2019 CLINICAL DATA:   Generalized abdominal pain. EXAM: CT ABDOMEN AND PELVIS WITH CONTRAST TECHNIQUE: Multidetector CT imaging of the abdomen and pelvis was performed using the standard protocol following bolus administration of intravenous contrast. CONTRAST:  118mL OMNIPAQUE IOHEXOL 300 MG/ML  SOLN COMPARISON:  November 09, 2008 FINDINGS: Lower chest: No acute abnormality. Hepatobiliary: No focal liver abnormality is seen. No gallstones, gallbladder wall thickening, or biliary dilatation. Pancreas: Unremarkable. No pancreatic ductal dilatation or surrounding inflammatory changes. Spleen: Normal in size without focal abnormality. Adrenals/Urinary Tract: Adrenal glands are unremarkable. Kidneys are normal, without renal calculi, focal lesion, or hydronephrosis. Bladder is unremarkable. Stomach/Bowel: The stomach and small bowel are normal. There is fat stranding adjacent to a thickened loop of sigmoid colon. There are a few diverticuli in this location. There is a small focus of extraluminal gas as seen on axial image 76 and coronal image 93 measuring up to 1.2 cm. No associated fluid in the region of the gas. The remainder of the colon is normal. The patient appears to be status post appendectomy. Vascular/Lymphatic: No significant vascular findings are present. No enlarged abdominal or pelvic lymph nodes. Reproductive: Prostate is unremarkable. Other: No abdominal wall hernia or abnormality. No abdominopelvic ascites. Musculoskeletal: No acute or significant osseous findings. IMPRESSION: 1. Fat stranding adjacent to a thickened loop of sigmoid colon with scattered diverticuli in this region is consistent with diverticulitis. A 1.2 cm focus of extraluminal gas is identified consistent with contained micro perforation. No abscess noted. 2. No other acute abnormalities. Electronically Signed   By: Dorise Bullion III M.D   On: 07/08/2019 12:50    Results/Tests Pending at Time of Discharge: None  Discharge Medications:  Allergies as  of 07/11/2019      Reactions   Ibuprofen Hives      Medication List    TAKE these medications   acetaminophen 500 MG tablet Commonly known as: TYLENOL Take 2 tablets (1,000 mg total) by mouth every 6 (six) hours.   amoxicillin-clavulanate 875-125 MG tablet Commonly known as: AUGMENTIN Take 1 tablet by mouth every 12 (twelve) hours for 7 days.   lisinopril 20 MG tablet Commonly known as: ZESTRIL Take 1 tablet (20 mg total) by mouth daily.   traMADol 50 MG tablet Commonly known as: ULTRAM Take 1-2 tablets (50-100 mg total) by mouth every 6 (six) hours as needed for up to 5 days for moderate pain or severe pain.       Discharge Instructions: Please refer to  Patient Instructions section of EMR for full details.  Patient was counseled important signs and symptoms that should prompt return to medical care, changes in medications, dietary instructions, activity restrictions, and follow up appointments.   Follow-Up Appointments: Follow-up Information    [facility]. Go on 07/16/2019.   Why: at 1:15pm for hospital follow up. Contact information: [address] [place] [address] [phone]          Nicki Guadalajara, MD 07/14/2019, 1:53 PM PGY-1, [facility] Family [facility]

## 2019-07-10 NOTE — Progress Notes (Signed)
Family Medicine Teaching Service Daily Progress Note Intern Pager: [phone]  Patient name: Rodney Rush Medical record number: [id number] Date of birth: [birth year] Age: 29 y.o. Gender: male  Primary Care Provider: Patient, No Pcp Per Consultants: none Code Status: full code  Pt Overview and Major Events to Date:  4/11: Admitted, Flagyl and ceftriaxone started 4/12: amlodipine started 4/13: captopril started   Assessment and Plan: Rodney Rush is a 29 y.o. male presenting with abdominal pain and emesis, found to have acute diverticulitis on CT. Past medical history significiant for seasonal allergies only.   Acute Uncomplicated Diverticulitis with microperforation:  Patient reports that his pain is improved and he would like to eat solid foods. Patient is excited to go home.  - transition to Augmentin to complete on 4/20  - vitals per floor protocol  - transition to solid food diet  - Dilaudid 1 mg every 3 hours as needed - Zofran 4 mg as needed - Up as tolerated - Tylenol as needed - miralax   New Onset Diabetes, likely Type 2 CBG within goal range of 140-180 overnight.  -CBG checks at dinner  -monitor glucose with BMP -no need for sliding scale insulin at this time   -We will add Metformin at discharge  Hypokalemia Patient's potassium low.  -Replenishd potassium -A.m. BMP   Elevated blood pressure: Stable. No previous diagnosis of hypertension. SBP overnight elevated and amlodipine 5mg  started. BP remains elevated at 176 systolic this AM. Does report a HA this am but patient attributes this to hunger. Will start captor  - to have first dose of  captopril 12.5 BID  - Monitor BP   FEN/GI: full liquids as tolerated  Prophylaxis: Lovenox   Disposition: dc pending toleration of oral hydration and toleration of PO abx   Subjective:  Patient reports improved pain, no nausea and is ready to eat solid foods   Objective: Temp:  [98.6 F (37 C)] 98.6 F (37 C)  (04/14 0450) Pulse Rate:  [72] 72 (04/14 0450) Resp:  [16] 16 (04/14 0450) BP: (158)/(92) 158/92 (04/14 0450) SpO2:  [96 %] 96 % (04/14 0450)  Physical Exam: General: Alert and cooperative and appears to be in no acute distress, sitting up in bed, more comfortable appearing and moving without showing signs of pain  Cardio: Normal S1 and S2, no S3 or S4. Rhythm is regular.  Pulm: Clear to auscultation bilaterally, no crackles, wheezing, or diminished breath sounds. Normal respiratory effort Abdomen: Bowel sounds normal. Abdomen soft, minimal tenderness in comparison to prior exam   Extremities: No peripheral edema. Warm and dry skin . Neuro: alert and oriented    Laboratory: Recent Labs  Lab 07/09/19 0404 07/10/19 0623 07/11/19 0500  WBC 16.8* 13.4* 10.7*  HGB 12.8* 12.7* 12.3*  HCT 38.4* 38.2* 36.5*  PLT 358 330 388   Recent Labs  Lab 07/08/19 0917 07/08/19 0917 07/09/19 0404 07/10/19 0623 07/11/19 0500  NA 136   < > 137 134* 136  K 4.1   < > 3.8 3.3* 3.3*  CL 102   < > 100 99 101  CO2 20*   < > 25 23 23   BUN 8   < > 10 8 8   CREATININE 0.88   < > 0.92 0.85 0.75  CALCIUM 9.8   < > 9.0 8.9 8.9  PROT 7.6  --   --   --   --   BILITOT 0.9  --   --   --   --  ALKPHOS 89  --   --   --   --   ALT 28  --   --   --   --   AST 23  --   --   --   --   GLUCOSE 207*   < > 145* 137* 127*   < > = values in this interval not displayed.     Imaging/Diagnostic Tests: No results found.   Rodney Klein, MD 07/11/2019, 2:34 PM PGY-1, [place] Intern pager: [phone], text pages welcome

## 2019-07-10 NOTE — Progress Notes (Signed)
Paged by nurse and that patient's blood pressure elevated to 170/108.  Patient with no history of hypertension though on chart review, it appears that patient has been hypertensive at multiple ED and office visits.  No antihypertensives on medication list.  Other vitals within normal limits.  No signs or symptoms of endorgan damage. _Started amlodipine 5 mg _Continue to titrate for goal blood pressures, per day team  Melene Plan, M.D.  6:45 AM 07/10/2019

## 2019-07-11 ENCOUNTER — Encounter (HOSPITAL_COMMUNITY): Payer: Self-pay | Admitting: *Deleted

## 2019-07-11 DIAGNOSIS — K5792 Diverticulitis of intestine, part unspecified, without perforation or abscess without bleeding: Secondary | ICD-10-CM | POA: Diagnosis not present

## 2019-07-11 DIAGNOSIS — R739 Hyperglycemia, unspecified: Secondary | ICD-10-CM | POA: Diagnosis not present

## 2019-07-11 DIAGNOSIS — E1165 Type 2 diabetes mellitus with hyperglycemia: Secondary | ICD-10-CM

## 2019-07-11 DIAGNOSIS — R030 Elevated blood-pressure reading, without diagnosis of hypertension: Secondary | ICD-10-CM | POA: Diagnosis not present

## 2019-07-11 LAB — CBC
HCT: 36.5 % — ABNORMAL LOW (ref 39.0–52.0)
Hemoglobin: 12.3 g/dL — ABNORMAL LOW (ref 13.0–17.0)
MCH: 28.7 pg (ref 26.0–34.0)
MCHC: 33.7 g/dL (ref 30.0–36.0)
MCV: 85.3 fL (ref 80.0–100.0)
Platelets: 388 10*3/uL (ref 150–400)
RBC: 4.28 MIL/uL (ref 4.22–5.81)
RDW: 13 % (ref 11.5–15.5)
WBC: 10.7 10*3/uL — ABNORMAL HIGH (ref 4.0–10.5)
nRBC: 0 % (ref 0.0–0.2)

## 2019-07-11 LAB — BASIC METABOLIC PANEL
Anion gap: 12 (ref 5–15)
BUN: 8 mg/dL (ref 6–20)
CO2: 23 mmol/L (ref 22–32)
Calcium: 8.9 mg/dL (ref 8.9–10.3)
Chloride: 101 mmol/L (ref 98–111)
Creatinine, Ser: 0.75 mg/dL (ref 0.61–1.24)
GFR calc Af Amer: >60 mL/min (ref >60)
GFR calc non Af Amer: >60 mL/min (ref >60)
Glucose, Bld: 127 mg/dL — ABNORMAL HIGH (ref 70–99)
Potassium: 3.3 mmol/L — ABNORMAL LOW (ref 3.5–5.1)
Sodium: 136 mmol/L (ref 135–145)

## 2019-07-11 LAB — GLUCOSE, CAPILLARY: Glucose-Capillary: 128 mg/dL — ABNORMAL HIGH (ref 70–99)

## 2019-07-11 MED ORDER — LIVING WELL WITH DIABETES BOOK
Freq: Once | Status: AC
  Administered 2019-07-11: 1
  Filled 2019-07-11: qty 1

## 2019-07-11 MED ORDER — AMOXICILLIN-POT CLAVULANATE 875-125 MG PO TABS: 1.0000 | ORAL_TABLET | Freq: Two times a day (BID) | ORAL | 0 refills | Status: AC

## 2019-07-11 MED ORDER — CAPTOPRIL 12.5 MG PO TABS: 12.5000 mg | ORAL_TABLET | Freq: Two times a day (BID) | ORAL | 0 refills | Status: DC

## 2019-07-11 MED ORDER — LISINOPRIL 20 MG PO TABS: 20.0000 mg | ORAL_TABLET | Freq: Every day | ORAL | 0 refills | Status: DC

## 2019-07-11 MED ORDER — ACETAMINOPHEN 500 MG PO TABS: 1000.0000 mg | ORAL_TABLET | Freq: Four times a day (QID) | ORAL | 0 refills | Status: DC

## 2019-07-11 MED ORDER — TRAMADOL HCL 50 MG PO TABS: 50.0000 mg | ORAL_TABLET | Freq: Four times a day (QID) | ORAL | 0 refills | Status: AC | PRN

## 2019-07-11 MED ORDER — AMOXICILLIN-POT CLAVULANATE 875-125 MG PO TABS
1.0000 | ORAL_TABLET | Freq: Two times a day (BID) | ORAL | Status: DC
  Administered 2019-07-11: 1 via ORAL
  Filled 2019-07-11: qty 1

## 2019-07-11 MED ORDER — TRAMADOL HCL 50 MG PO TABS: 50.0000 mg | ORAL_TABLET | Freq: Four times a day (QID) | ORAL | Status: DC | PRN

## 2019-07-11 MED FILL — ACETAMINOPHEN 500MG XT STRE: 500 | 3 days supply | Qty: 30 | Fill #0

## 2019-07-11 MED FILL — LISINOPRIL 20 MG TABLET: 20 | 30 days supply | Qty: 30 | Fill #0

## 2019-07-11 MED FILL — AMOX-CLAV 875-125 MG TABLET: 875-125 | 13 days supply | Qty: 13 | Fill #0

## 2019-07-11 MED FILL — traMADol HCL 50 MG TABS: 50 | 5 days supply | Qty: 20 | Fill #0

## 2019-07-11 NOTE — Progress Notes (Signed)
Pt discharged

## 2019-07-11 NOTE — Progress Notes (Signed)
Inpatient Diabetes Program Recommendations  AACE/ADA: New Consensus Statement on Inpatient Glycemic Control (2015)  Target Ranges:  Prepandial:   less than 140 mg/dL      Peak postprandial:   less than 180 mg/dL (1-2 hours)      Critically ill patients:  140 - 180 mg/dL   Lab Results  Component Value Date   GLUCAP 128 (H) 07/11/2019   HGBA1C 7.7 (H) 07/08/2019    Review of Glycemic Control Results for TARAS, RASK (MRN [id number]) as of 07/11/2019 13:33  Ref. Range 07/09/2019 12:13 07/09/2019 20:14 07/10/2019 07:55 07/11/2019 07:55  Glucose-Capillary Latest Ref Range: 70 - 99 mg/dL 326 (H) 92 712 (H) 458 (H)   Diabetes history: New onset Outpatient Diabetes medications: none Current orders for Inpatient glycemic control: none  Inpatient Diabetes Program Recommendations:    Consider switching to carb modified diet.   Spoke with patient and significant other regarding new onset diabetes diagnosis.  Reviewed patient's current A1c of 7.7%. Explained what a A1c is and what it measures. Also reviewed goal A1c with patient, importance of good glucose control @ home, and blood sugar goals. Reviewed patho fo DM, role of pancreas, role of insulin, insulin resistance, using glucose for energy, vascular changes, impact pf glucose trends and infection risks and other commorbidities.  Patient will need a meter at discharge. Blood glucose meter (inlcudes lancets and strips) (#[id number]). Reviewed frequency of blood sugars checks per ADA guidelines. Also, discussed when to call MD, survival skills and importance of having close follow up. Patient plans to find PCP and make appointment.  Reviewed importance of carbohydrate mindfulness, how to read labels, alternatives for beverages, counting carbs, daily alottment, plate method and goal setting. Patient admits to consuming large amount of sugary beverages and eats alot of fried foods. Willing to work to improve this. Dietitian and outpatient education  referrals placed.  Patient has no further questions at this time.   Thanks, Lujean Rave, MSN, RNC-OB Diabetes Coordinator [phone] (8a-5p)

## 2019-07-11 NOTE — Progress Notes (Signed)
Reviewed DC nstructions with pt and family, awaiting TOC meds and pt will be discharged.

## 2019-07-11 NOTE — Discharge Instructions (Signed)
Thank you so much for allowing [place] to be apart of your care! You were admitted for diverticulitis (inflammation of the outpouchings in your colon), which we have provided additional discussion below. You received IV antibiotics, fluids, and pain control which helped you improve over the last several days. We have sent you home with Augmentin, this is an antibiotic. This sometimes can cause some GI upset and diarrhea. Please take your next dose of the Augmentin tonight (evening of 4/14).  While you were here, you were found to have high sugars and diagnosed with type 2 diabetes. We would like you to start a medication called metformin once you follow up outpatient that will help your body be able to handle your sugar better. We also encourage you to follow a balanced diet and increase your physical activity.   Lastly, your blood pressure has been high while your were here and started on Lisinopril ( a BP medication). Please make sure you continue to take this at home and will follow up in the clinic for your BP.    Diverticulitis  Diverticulitis is when small pockets in your large intestine (colon) get infected or swollen. This causes stomach pain and watery poop (diarrhea). These pouches are called diverticula. They form in people who have a condition called diverticulosis. Follow these instructions at home: Medicines  Take over-the-counter and prescription medicines only as told by your doctor. These include: ? Antibiotics. ? Pain medicines. ? Fiber pills. ? Probiotics. ? Stool softeners.  Do not drive or use heavy machinery while taking prescription pain medicine.  If you were prescribed an antibiotic, take it as told. Do not stop taking it even if you feel better. General instructions   Follow a diet as told by your doctor.  When you feel better, your doctor may tell you to change your diet. You may need to eat a lot of fiber. Fiber makes it easier to poop (have bowel movements).  Healthy foods with fiber include: ? Berries. ? Beans. ? Lentils. ? Green vegetables.  Exercise 3 or more times a week. Aim for 30 minutes each time. Exercise enough to sweat and make your heart beat faster.  Keep all follow-up visits as told. This is important. You may need to have an exam of the large intestine. This is called a colonoscopy. Contact a doctor if:  Your pain does not get better.  You have a hard time eating or drinking.  You are not pooping like normal. Get help right away if:  Your pain gets worse.  Your problems do not get better.  Your problems get worse very fast.  You have a fever.  You throw up (vomit) more than one time.  You have poop that is: ? Bloody. ? Black. ? Tarry. Summary  Diverticulitis is when small pockets in your large intestine (colon) get infected or swollen.  Take medicines only as told by your doctor.  Follow a diet as told by your doctor. This information is not intended to replace advice given to you by your health care provider. Make sure you discuss any questions you have with your health care provider. Document Revised: 02/25/2017 Document Reviewed: 04/01/2016 Elsevier Patient Education  2020 [facility] [phone]) 1. Dr. Philemon Kingdom 2. Dr. Janie [facility] [phone]) 1. Dr. Delrae Rend [facility] [phone]) 1. Dr. Jacelyn Pi 2. Dr. Anda Kraft [facility] [phone]) 1. Dr. Daneil Dolin [facility] [phone]) [[place]  office]  ([phone]) [[place] office] 1. Dr. Lenna Sciara [place] 2. Dr. Mee Hives [facility]) [phone]) 1. Autumn Hudnall Ronnald Ramp), PA 2. Dr. Amalia Greenhouse 3. Dr. Marsh Dolly. [facility] [phone]) 1. Dr. Glade Lloyd [facility] [phone]) 1. Dr. Orville Govern 2. Dr. Lelon Huh 3. Dr. Jerelene Redden 4. Alwyn Ren, FNP Dr. Carolynn Serve. [facility] in [place] [phone]) Hypoglycemia Hypoglycemia is when the sugar (glucose) level in your blood is too low. Signs of low blood sugar may include:  Feeling: ? Hungry. ? Worried or nervous (anxious). ? Sweaty and clammy. ? Confused. ? Dizzy. ? Sleepy. ? Sick to your stomach (nauseous).  Having: ? A fast heartbeat. ? A headache. ? A change in your vision. ? Tingling or no feeling (numbness) around your mouth, lips, or tongue. ? Jerky movements that you cannot control (seizure).  Having trouble with: ? Moving (coordination). ? Sleeping. ? Passing out (fainting). ? Getting upset easily (irritability). Low blood sugar can happen to people who have diabetes and people who do not have diabetes. Low blood sugar can happen quickly, and it can be an emergency. Treating low blood sugar Low blood sugar is often treated by eating or drinking something sugary right away, such as:  Fruit juice, 4-6 oz (120-150 mL).  Regular soda (not diet soda), 4-6 oz (120-150 mL).  Low-fat milk, 4 oz (120 mL).  Several pieces of hard candy.  Sugar or honey, 1 Tbsp (15 mL). Treating low blood sugar if you have diabetes If you can think clearly and swallow safely, follow the 15:15 rule:  Take 15 grams of a fast-acting carb (carbohydrate). Talk with your doctor about how much you should take.  Always keep a source of fast-acting carb with you, such as: ? Sugar tablets (glucose pills). Take 3-4 pills. ? 6-8 pieces of hard candy. ? 4-6 oz (120-150 mL) of fruit juice. ? 4-6 oz (120-150 mL) of regular (not diet) soda. ? 1 Tbsp (15 mL) honey or sugar.  Check your blood sugar 15 minutes after you take the carb.  If your blood sugar is still at or below 70 mg/dL (3.9 mmol/L), take 15 grams of a carb again.  If your blood sugar does not go above 70 mg/dL (3.9 mmol/L) after 3  tries, get help right away.  After your blood sugar goes back to normal, eat a meal or a snack within 1 hour.  Treating very low blood sugar If your blood sugar is at or below 54 mg/dL (3 mmol/L), you have very low blood sugar (severe hypoglycemia). This may also cause:  Passing out.  Jerky movements you cannot control (seizure).  Losing consciousness (coma). This is an emergency. Do not wait to see if the symptoms will go away. Get medical help right away. Call your local emergency services (911 in the [place] Do not drive yourself to the hospital. If you have very low blood sugar and you cannot eat or drink, you may need a glucagon shot (injection). A family member or friend should learn how to check your blood sugar and how to give you a glucagon shot. Ask your doctor if you need to have a glucagon shot kit at home. Follow these instructions at home: General instructions  Take over-the-counter and prescription medicines only as told by your doctor.  Stay aware of your blood sugar as told by your doctor.  Limit alcohol intake to no more than 1 drink a  day for nonpregnant women and 2 drinks a day for men. One drink equals 12 oz of beer (355 mL), 5 oz of wine (148 mL), or 1 oz of hard liquor (44 mL).  Keep all follow-up visits as told by your doctor. This is important. If you have diabetes:   Follow your diabetes care plan as told by your doctor. Make sure you: ? Know the signs of low blood sugar. ? Take your medicines as told. ? Follow your exercise and meal plan. ? Eat on time. Do not skip meals. ? Check your blood sugar as often as told by your doctor. Always check it before and after exercise. ? Follow your sick day plan when you cannot eat or drink normally. Make this plan ahead of time with your doctor.  Share your diabetes care plan with: ? Your work or school. ? People you live with.  Check your pee (urine) for ketones: ? When you are sick. ? As told by your  doctor.  Carry a card or wear jewelry that says you have diabetes. Contact a doctor if:  You have trouble keeping your blood sugar in your target range.  You have low blood sugar often. Get help right away if:  You still have symptoms after you eat or drink something sugary.  Your blood sugar is at or below 54 mg/dL (3 mmol/L).  You have jerky movements that you cannot control.  You pass out. These symptoms may be an emergency. Do not wait to see if the symptoms will go away. Get medical help right away. Call your local emergency services (911 in the [place] Do not drive yourself to the hospital. Summary  Hypoglycemia happens when the level of sugar (glucose) in your blood is too low.  Low blood sugar can happen to people who have diabetes and people who do not have diabetes. Low blood sugar can happen quickly, and it can be an emergency.  Make sure you know the signs of low blood sugar and know how to treat it.  Always keep a source of sugar (fast-acting carb) with you to treat low blood sugar. This information is not intended to replace advice given to you by your health care provider. Make sure you discuss any questions you have with your health care provider. Document Revised: 07/06/2018 Document Reviewed: 04/18/2015 Elsevier Patient Education  [place].  Hyperglycemia Hyperglycemia occurs when the level of sugar (glucose) in the blood is too high. Glucose is a type of sugar that provides the body's main source of energy. Certain hormones (insulin and glucagon) control the level of glucose in the blood. Insulin lowers blood glucose, and glucagon increases blood glucose. Hyperglycemia can result from having too little insulin in the bloodstream, or from the body not responding normally to insulin. Hyperglycemia occurs most often in people who have diabetes (diabetes mellitus), but it can happen in people who do not have diabetes. It can develop quickly, and it can be  life-threatening if it causes you to become severely dehydrated (diabetic ketoacidosis or hyperglycemic hyperosmolar state). Severe hyperglycemia is a medical emergency. What are the causes? If you have diabetes, hyperglycemia may be caused by:  Diabetes medicine.  Medicines that increase blood glucose or affect your diabetes control.  Not eating enough, or not eating often enough.  Changes in physical activity level.  Being sick or having an infection. If you have prediabetes or undiagnosed diabetes:  Hyperglycemia may be caused by those conditions. If you do not  have diabetes, hyperglycemia may be caused by:  Certain medicines, including steroid medicines, beta-blockers, epinephrine, and thiazide diuretics.  Stress.  Serious illness.  Surgery.  Diseases of the pancreas.  Infection. What increases the risk? Hyperglycemia is more likely to develop in people who have risk factors for diabetes, such as:  Having a family member with diabetes.  Having a gene for type 1 diabetes that is passed from parent to child (inherited).  Living in an area with cold weather conditions.  Exposure to certain viruses.  Certain conditions in which the body's disease-fighting (immune) system attacks itself (autoimmune disorders).  Being overweight or obese.  Having an inactive (sedentary) lifestyle.  Having been diagnosed with insulin resistance.  Having a history of prediabetes, gestational diabetes, or polycystic ovarian syndrome (PCOS).  Being of American-Indian, African-American, Hispanic/Latino, or Asian/Pacific Islander descent. What are the signs or symptoms? Hyperglycemia may not cause any symptoms. If you do have symptoms, they may include early warning signs, such as:  Increased thirst.  Hunger.  Feeling very tired.  Needing to urinate more often than usual.  Blurry vision. Other symptoms may develop if hyperglycemia gets worse, such as:  Dry mouth.  Loss of  appetite.  Fruity-smelling breath.  Weakness.  Unexpected or rapid weight gain or weight loss.  Tingling or numbness in the hands or feet.  Headache.  Skin that does not quickly return to normal after being lightly pinched and released (poor skin turgor).  Abdominal pain.  Cuts or bruises that are slow to heal. How is this diagnosed? Hyperglycemia is diagnosed with a blood test to measure your blood glucose level. This blood test is usually done while you are having symptoms. Your health care provider may also do a physical exam and review your medical history. You may have more tests to determine the cause of your hyperglycemia, such as:  A fasting blood glucose (FBG) test. You will not be allowed to eat (you will fast) for at least 8 hours before a blood sample is taken.  An A1c (hemoglobin A1c) blood test. This provides information about blood glucose control over the previous 2-3 months.  An oral glucose tolerance test (OGTT). This measures your blood glucose at two times: ? After fasting. This is your baseline blood glucose level. ? Two hours after drinking a beverage that contains glucose. How is this treated? Treatment depends on the cause of your hyperglycemia. Treatment may include:  Taking medicine to regulate your blood glucose levels. If you take insulin or other diabetes medicines, your medicine or dosage may be adjusted.  Lifestyle changes, such as exercising more, eating healthier foods, or losing weight.  Treating an illness or infection, if this caused your hyperglycemia.  Checking your blood glucose more often.  Stopping or reducing steroid medicines, if these caused your hyperglycemia. If your hyperglycemia becomes severe and it results in hyperglycemic hyperosmolar state, you must be hospitalized and given IV fluids. Follow these instructions at home:  General instructions  Take over-the-counter and prescription medicines only as told by your health  care provider.  Do not use any products that contain nicotine or tobacco, such as cigarettes and e-cigarettes. If you need help quitting, ask your health care provider.  Limit alcohol intake to no more than 1 drink per day for nonpregnant women and 2 drinks per day for men. One drink equals 12 oz of beer, 5 oz of wine, or 1 oz of hard liquor.  Learn to manage stress. If you need help with  this, ask your health care provider.  Keep all follow-up visits as told by your health care provider. This is important. Eating and drinking   Maintain a healthy weight.  Exercise regularly, as directed by your health care provider.  Stay hydrated, especially when you exercise, get sick, or spend time in hot temperatures.  Eat healthy foods, such as: ? Lean proteins. ? Complex carbohydrates. ? Fresh fruits and vegetables. ? Low-fat dairy products. ? Healthy fats.  Drink enough fluid to keep your urine clear or pale yellow. If you have diabetes:  Make sure you know the symptoms of hyperglycemia.  Follow your diabetes management plan, as told by your health care provider. Make sure you: ? Take your insulin and medicines as directed. ? Follow your exercise plan. ? Follow your meal plan. Eat on time, and do not skip meals. ? Check your blood glucose as often as directed. Make sure to check your blood glucose before and after exercise. If you exercise longer or in a different way than usual, check your blood glucose more often. ? Follow your sick day plan whenever you cannot eat or drink normally. Make this plan in advance with your health care provider.  Share your diabetes management plan with people in your workplace, school, and household.  Check your urine for ketones when you are ill and as told by your health care provider.  Carry a medical alert card or wear medical alert jewelry. Contact a health care provider if:  Your blood glucose is at or above 240 mg/dL (13.3 mmol/L) for 2 days  in a row.  You have problems keeping your blood glucose in your target range.  You have frequent episodes of hyperglycemia. Get help right away if:  You have difficulty breathing.  You have a change in how you think, feel, or act (mental status).  You have nausea or vomiting that does not go away. These symptoms may represent a serious problem that is an emergency. Do not wait to see if the symptoms will go away. Get medical help right away. Call your local emergency services (911 in the [place] Do not drive yourself to the hospital. Summary  Hyperglycemia occurs when the level of sugar (glucose) in the blood is too high.  Hyperglycemia is diagnosed with a blood test to measure your blood glucose level. This blood test is usually done while you are having symptoms. Your health care provider may also do a physical exam and review your medical history.  If you have diabetes, follow your diabetes management plan as told by your health care provider.  Contact your health care provider if you have problems keeping your blood glucose in your target range. This information is not intended to replace advice given to you by your health care provider. Make sure you discuss any questions you have with your health care provider. Document Revised: 12/01/2015 Document Reviewed: 12/01/2015 Elsevier Patient Education  [place].  Blood Glucose Monitoring, Adult Monitoring your blood sugar (glucose) is an important part of managing your diabetes (diabetes mellitus). Blood glucose monitoring involves checking your blood glucose as often as directed and keeping a record (log) of your results over time. Checking your blood glucose regularly and keeping a blood glucose log can:  Help you and your health care provider adjust your diabetes management plan as needed, including your medicines or insulin.  Help you understand how food, exercise, illnesses, and medicines affect your blood  glucose.  Let you know what your blood  glucose is at any time. You can quickly find out if you have low blood glucose (hypoglycemia) or high blood glucose (hyperglycemia). Your health care provider will set individualized treatment goals for you. Your goals will be based on your age, other medical conditions you have, and how you respond to diabetes treatment. Generally, the goal of treatment is to maintain the following blood glucose levels:  Before meals (preprandial): 80-130 mg/dL (4.4-7.2 mmol/L).  After meals (postprandial): below 180 mg/dL (10 mmol/L).  A1c level: less than 7%. Supplies needed:  Blood glucose meter.  Test strips for your meter. Each meter has its own strips. You must use the strips that came with your meter.  A needle to prick your finger (lancet). Do not use a lancet more than one time.  A device that holds the lancet (lancing device).  A journal or log book to write down your results. How to check your blood glucose  1. Wash your hands with soap and water. 2. Prick the side of your finger (not the tip) with the lancet. Use a different finger each time. 3. Gently rub the finger until a small drop of blood appears. 4. Follow instructions that come with your meter for inserting the test strip, applying blood to the strip, and using your blood glucose meter. 5. Write down your result and any notes. Some meters allow you to use areas of your body other than your finger (alternative sites) to test your blood. The most common alternative sites are:  Forearm.  Thigh.  Palm of the hand. If you think you may have hypoglycemia, or if you have a history of not knowing when your blood glucose is getting low (hypoglycemia unawareness), do not use alternative sites. Use your finger instead. Alternative sites may not be as accurate as the fingers, because blood flow is slower in these areas. This means that the result you get may be delayed, and it may be different from  the result that you would get from your finger. Follow these instructions at home: Blood glucose log   Every time you check your blood glucose, write down your result. Also write down any notes about things that may be affecting your blood glucose, such as your diet and exercise for the day. This information can help you and your health care provider: ? Look for patterns in your blood glucose over time. ? Adjust your diabetes management plan as needed.  Check if your meter allows you to download your records to a computer. Most glucose meters store a record of glucose readings in the meter. If you have type 1 diabetes:  Check your blood glucose 2 or more times a day.  Also check your blood glucose: ? Before every insulin injection. ? Before and after exercise. ? Before meals. ? 2 hours after a meal. ? Occasionally between 2:00 a.m. and 3:00 a.m., as directed. ? Before potentially dangerous tasks, like driving or using heavy machinery. ? At bedtime.  You may need to check your blood glucose more often, up to 6-10 times a day, if you: ? Use an insulin pump. ? Need multiple daily injections (MDI). ? Have diabetes that is not well-controlled. ? Are ill. ? Have a history of severe hypoglycemia. ? Have hypoglycemia unawareness. If you have type 2 diabetes:  If you take insulin or other diabetes medicines, check your blood glucose 2 or more times a day.  If you are on intensive insulin therapy, check your blood glucose 4 or more  times a day. Occasionally, you may also need to check between 2:00 a.m. and 3:00 a.m., as directed.  Also check your blood glucose: ? Before and after exercise. ? Before potentially dangerous tasks, like driving or using heavy machinery.  You may need to check your blood glucose more often if: ? Your medicine is being adjusted. ? Your diabetes is not well-controlled. ? You are ill. General tips  Always keep your supplies with you.  If you have  questions or need help, all blood glucose meters have a 24-hour "hotline" phone number that you can call. You may also contact your health care provider.  After you use a few boxes of test strips, adjust (calibrate) your blood glucose meter by following instructions that came with your meter. Contact a health care provider if:  Your blood glucose is at or above 240 mg/dL (13.3 mmol/L) for 2 days in a row.  You have been sick or have had a fever for 2 days or longer, and you are not getting better.  You have any of the following problems for more than 6 hours: ? You cannot eat or drink. ? You have nausea or vomiting. ? You have diarrhea. Get help right away if:  Your blood glucose is lower than 54 mg/dL (3 mmol/L).  You become confused or you have trouble thinking clearly.  You have difficulty breathing.  You have moderate or large ketone levels in your urine. Summary  Monitoring your blood sugar (glucose) is an important part of managing your diabetes (diabetes mellitus).  Blood glucose monitoring involves checking your blood glucose as often as directed and keeping a record (log) of your results over time.  Your health care provider will set individualized treatment goals for you. Your goals will be based on your age, other medical conditions you have, and how you respond to diabetes treatment.  Every time you check your blood glucose, write down your result. Also write down any notes about things that may be affecting your blood glucose, such as your diet and exercise for the day. This information is not intended to replace advice given to you by your health care provider. Make sure you discuss any questions you have with your health care provider. Document Revised: 01/06/2018 Document Reviewed: 08/25/2015 Elsevier Patient Education  [place].  Hemoglobin A1c Test Why am I having this test? You may have the hemoglobin A1c test (HbA1c test) done to:  Evaluate your  risk for developing diabetes (diabetes mellitus).  Diagnose diabetes.  Monitor long-term control of blood sugar (glucose) in people who have diabetes and help make treatment decisions. This test may be done with other blood glucose tests, such as fasting blood glucose and oral glucose tolerance tests. What is being tested? Hemoglobin is a type of protein in the blood that carries oxygen. Glucose attaches to hemoglobin to form glycated hemoglobin. This test checks the amount of glycated hemoglobin in your blood, which is a good indicator of the average amount of glucose in your blood during the past 2-3 months. What kind of sample is taken?  A blood sample is required for this test. It is usually collected by inserting a needle into a blood vessel. Tell a health care provider about:  All medicines you are taking, including vitamins, herbs, eye drops, creams, and over-the-counter medicines.  Any blood disorders you have.  Any surgeries you have had.  Any medical conditions you have.  Whether you are pregnant or may be pregnant. How are  the results reported? Your results will be reported as a percentage that indicates how much of your hemoglobin has glucose attached to it (is glycated). Your health care provider will compare your results to normal ranges that were established after testing a large group of people (reference ranges). Reference ranges may vary among labs and hospitals. For this test, common reference ranges are:  Adult or child without diabetes: 4-5.6%.  Adult or child with diabetes and good blood glucose control: less than 7%. What do the results mean? If you have diabetes:  A result of less than 7% is considered normal, meaning that your blood glucose is well controlled.  A result higher than 7% means that your blood glucose is not well controlled, and your treatment plan may need to be adjusted. If you do not have diabetes:  A result within the reference range is  considered normal, meaning that you are not at high risk for diabetes.  A result of 5.7-6.4% means that you have a high risk of developing diabetes, and you may have prediabetes. Prediabetes is the condition of having a blood glucose level that is higher than it should be, but not high enough for you to be diagnosed with diabetes. Having prediabetes puts you at risk for developing type 2 diabetes (type 2 diabetes mellitus). You may have more tests, including a repeat HbA1c test.  Results of 6.5% or higher on two separate HbA1c tests mean that you have diabetes. You may have more tests to confirm the diagnosis. Abnormally low HbA1c values may be caused by:  Pregnancy.  Severe blood loss.  Receiving donated blood (transfusions).  Low red blood cell count (anemia).  Long-term kidney failure.  Some unusual forms (variants) of hemoglobin. Talk with your health care provider about what your results mean. Questions to ask your health care provider Ask your health care provider, or the department that is doing the test:  When will my results be ready?  How will I get my results?  What are my treatment options?  What other tests do I need?  What are my next steps? Summary  The hemoglobin A1c test (HbA1c test) may be done to evaluate your risk for developing diabetes, to diagnose diabetes, and to monitor long-term control of blood sugar (glucose) in people who have diabetes and help make treatment decisions.  Hemoglobin is a type of protein in the blood that carries oxygen. Glucose attaches to hemoglobin to form glycated hemoglobin. This test checks the amount of glycated hemoglobin in your blood, which is a good indicator of the average amount of glucose in your blood during the past 2-3 months.  Talk with your health care provider about what your results mean. This information is not intended to replace advice given to you by your health care provider. Make sure you discuss any  questions you have with your health care provider. Document Revised: 02/25/2017 Document Reviewed: 10/26/2016 Elsevier Patient Education  [place].

## 2019-07-11 NOTE — Progress Notes (Signed)
Pt for DC to home, will review DC instructions, pt has Diabetes book and has spoken with the diabetes coordinator.

## 2019-07-16 ENCOUNTER — Other Ambulatory Visit: Payer: Self-pay

## 2019-07-16 ENCOUNTER — Inpatient Hospital Stay: Payer: BLUE CROSS/BLUE SHIELD

## 2019-07-16 ENCOUNTER — Ambulatory Visit (HOSPITAL_COMMUNITY): Payer: BLUE CROSS/BLUE SHIELD | Attending: Obstetrics and Gynecology

## 2019-07-16 DIAGNOSIS — Z319 Encounter for procreative management, unspecified: Secondary | ICD-10-CM

## 2019-07-17 LAB — C/C RBC ANTIGEN TYPING
Big C: NEGATIVE
Little c: POSITIVE

## 2019-08-02 ENCOUNTER — Other Ambulatory Visit: Payer: Self-pay | Admitting: Obstetrics and Gynecology

## 2019-12-17 ENCOUNTER — Other Ambulatory Visit: Payer: Self-pay

## 2019-12-17 ENCOUNTER — Ambulatory Visit (INDEPENDENT_AMBULATORY_CARE_PROVIDER_SITE_OTHER): Payer: BC Managed Care – PPO

## 2019-12-17 DIAGNOSIS — Z23 Encounter for immunization: Secondary | ICD-10-CM | POA: Diagnosis not present

## 2019-12-17 NOTE — Progress Notes (Signed)
° °  Covid-19 Vaccination Clinic  Name:  BENEDICTO CAPOZZI    MRN: [id number] DOB: Oct 01, 1990  12/17/2019  Mr. Ewart was observed post Covid-19 immunization for 15 minutes without incident. He was provided with Vaccine Information Sheet and instruction to access the V-Safe system.   Mr. Dyas was instructed to call 911 with any severe reactions post vaccine:  Difficulty breathing   Swelling of face and throat   A fast heartbeat   A bad rash all over body   Dizziness and weakness   Immunizations Administered    Name Date Dose VIS Date Route   Pfizer COVID-19 Vaccine 12/17/2019 10:50 AM 0.3 mL 05/23/2018 Intramuscular   Manufacturer: [employer], [employer]   Lot: [id number]   NDC: [address]-5

## 2019-12-25 ENCOUNTER — Ambulatory Visit: Payer: BLUE CROSS/BLUE SHIELD | Admitting: Podiatry

## 2020-01-07 ENCOUNTER — Ambulatory Visit: Payer: BC Managed Care – PPO | Admitting: Podiatry

## 2020-01-12 ENCOUNTER — Ambulatory Visit: Payer: BC Managed Care – PPO

## 2021-08-28 IMAGING — CT CT ABD-PELV W/ CM
2 of 4 series · 17 of 46 positions shown, 19 images · IV contrast (omnipaque)
Comparison: November 09, 2008

CLINICAL DATA: Generalized abdominal pain.

EXAM:
CT ABDOMEN AND PELVIS WITH CONTRAST
TECHNIQUE: Multidetector CT imaging of the abdomen and pelvis was performed
using the standard protocol following bolus administration of
intravenous contrast.
CONTRAST:  100mL OMNIPAQUE IOHEXOL 300 MG/ML  SOLN

[Series 3: a/p w/ 5mm · axial · 0.97mm/px · z∈[+742,+1252]mm · 14 of 112 slices shown, 16 images]
[im 5/112  soft-tissue]
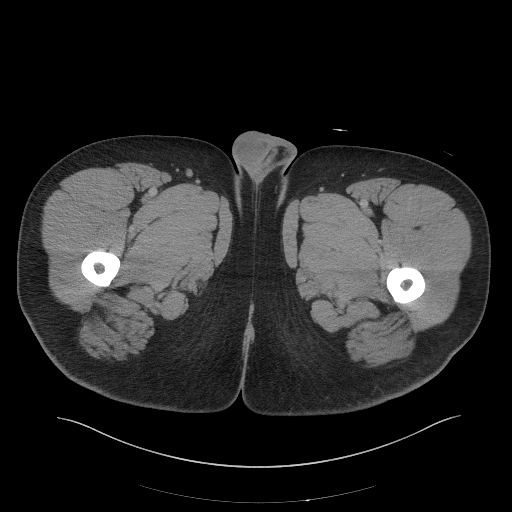
[im 5/112  bone]
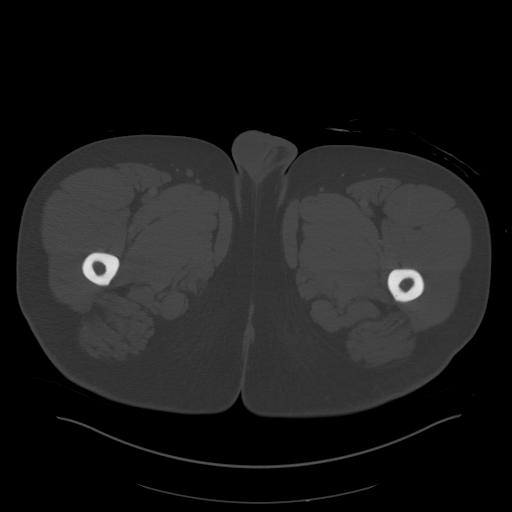
[im 15/112  soft-tissue]
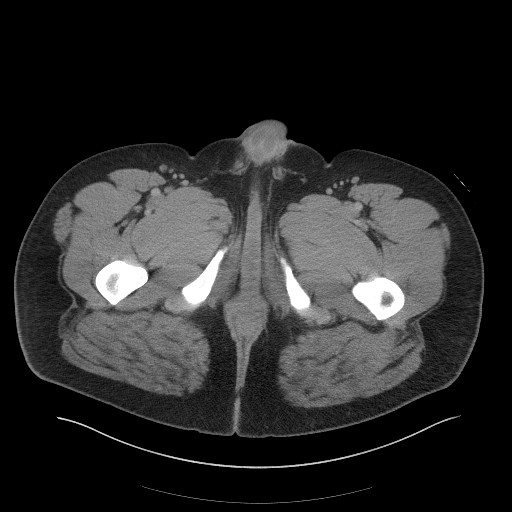
[im 20/112  soft-tissue]
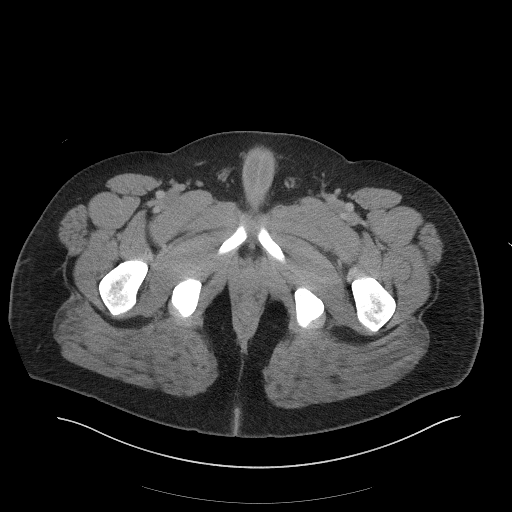
[im 29/112  soft-tissue]
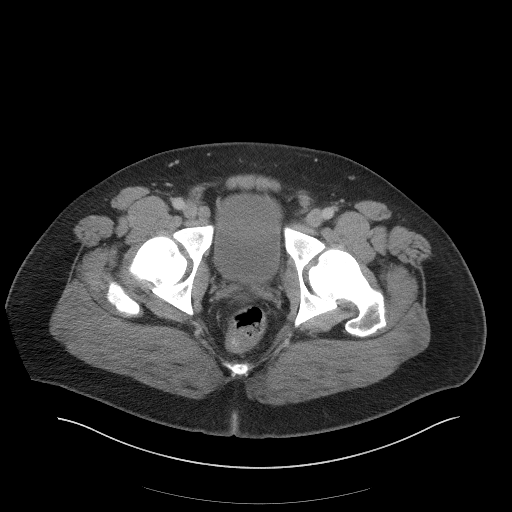
[im 39/112  soft-tissue]
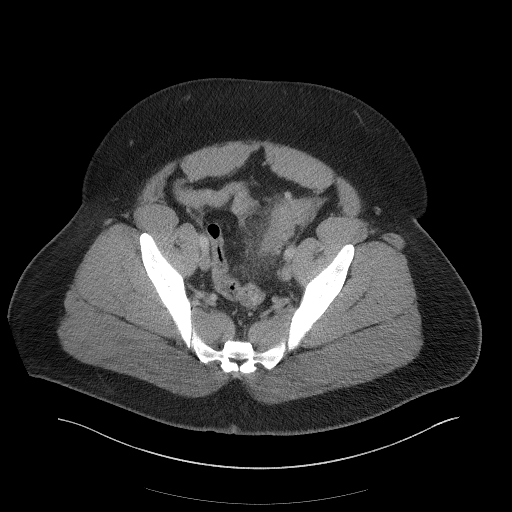
[im 44/112  soft-tissue]
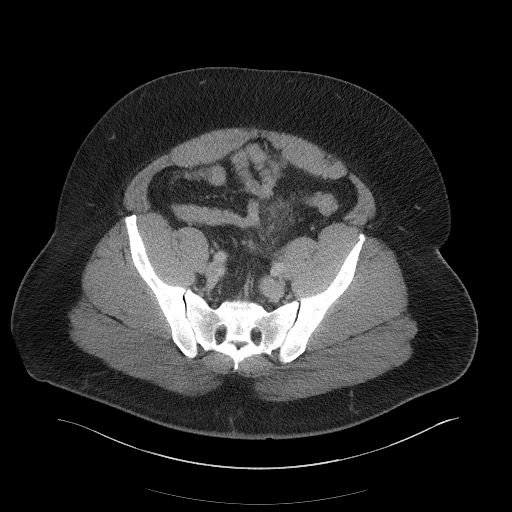
[im 54/112  soft-tissue]
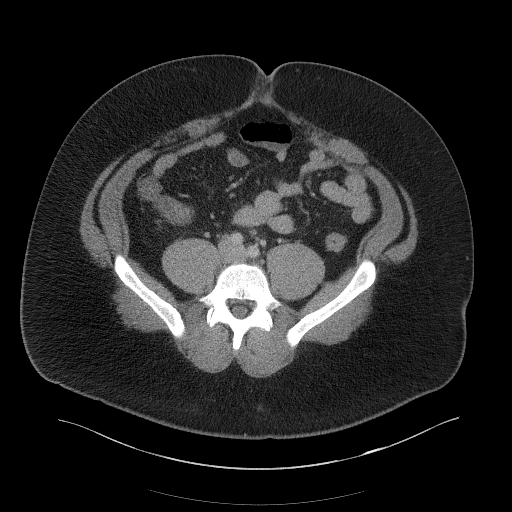
[im 58/112  soft-tissue]
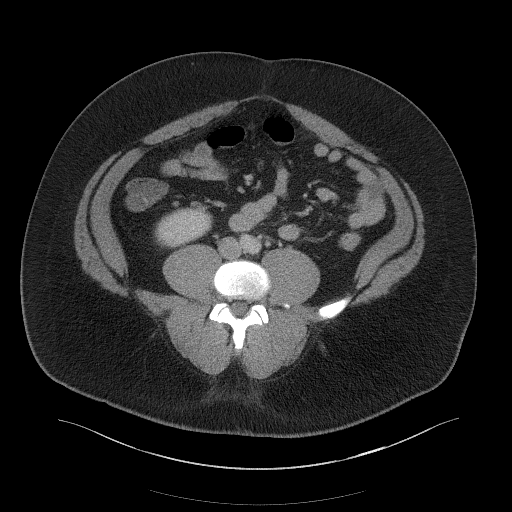
[im 68/112  soft-tissue]
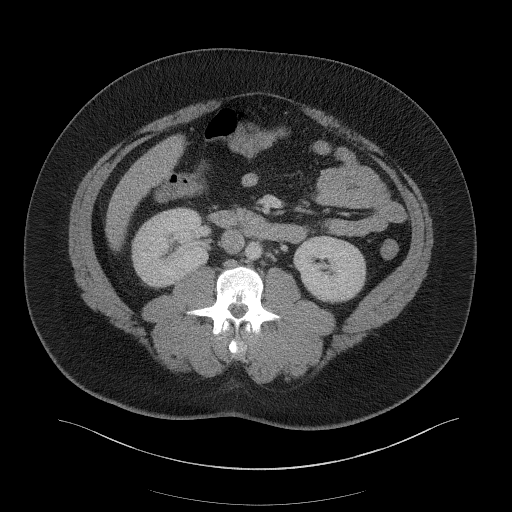
[im 68/112  bone]
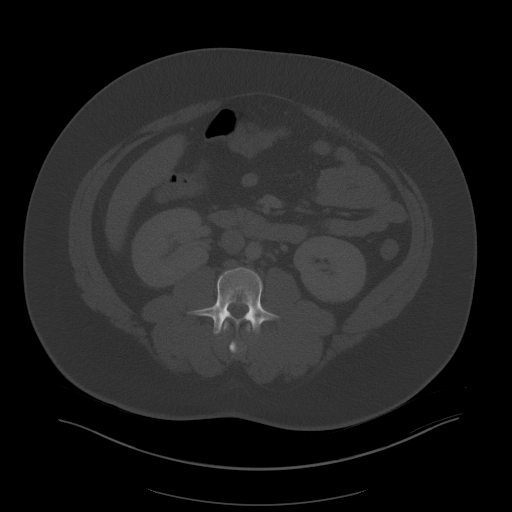
[im 73/112  soft-tissue]
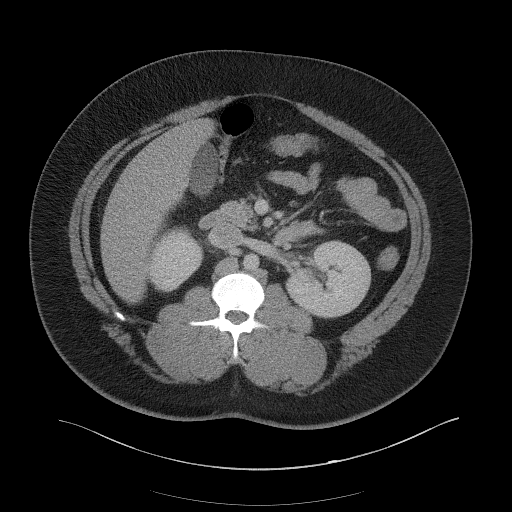
[im 83/112  soft-tissue]
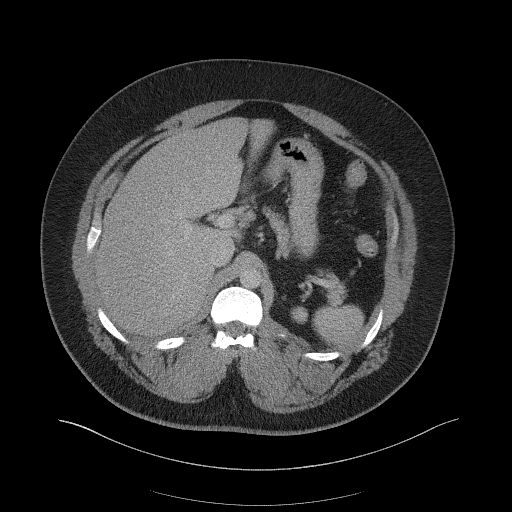
[im 92/112  soft-tissue]
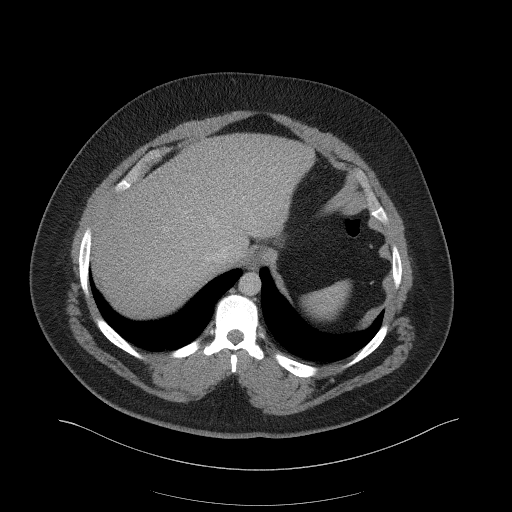
[im 97/112  soft-tissue]
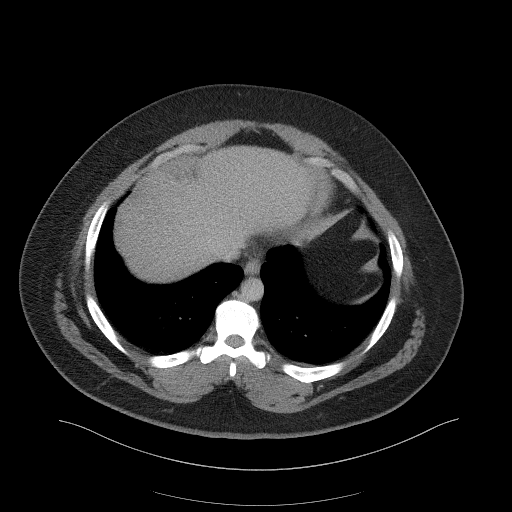
[im 107/112  soft-tissue]
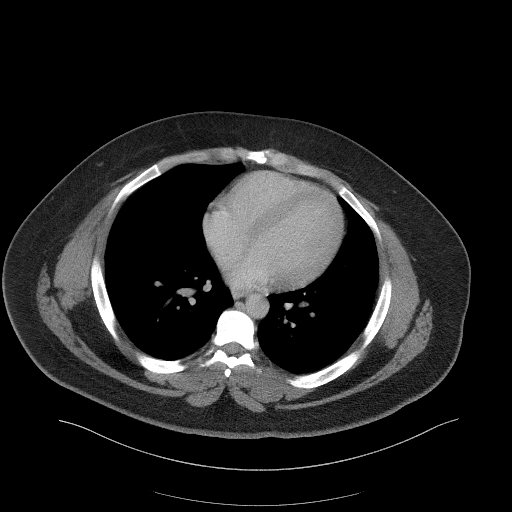

[Series 6: a/p w/ cor · coronal · 1.09mm/px · 3 of 209 slices shown]
[im 70/209  soft-tissue]
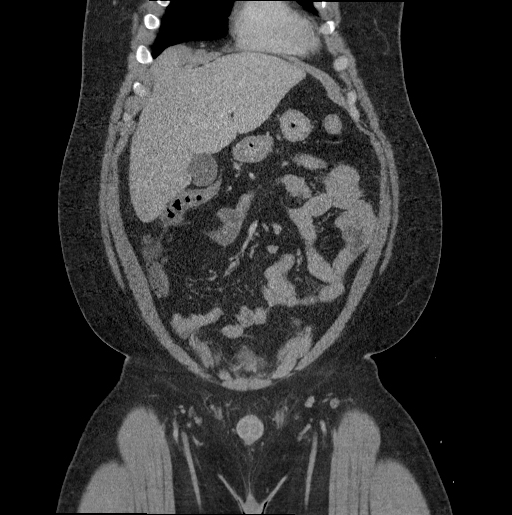
[im 93/209  soft-tissue]
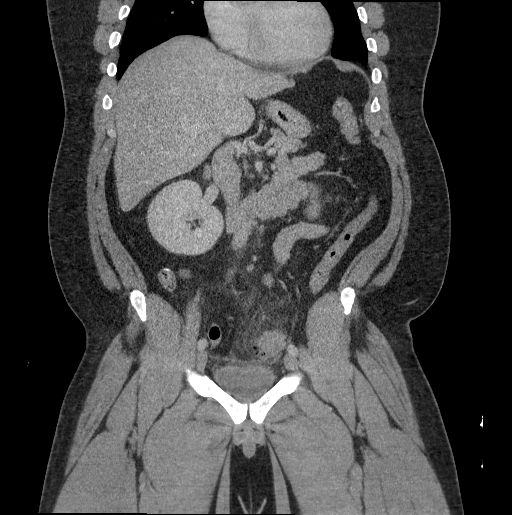
[im 116/209  soft-tissue]
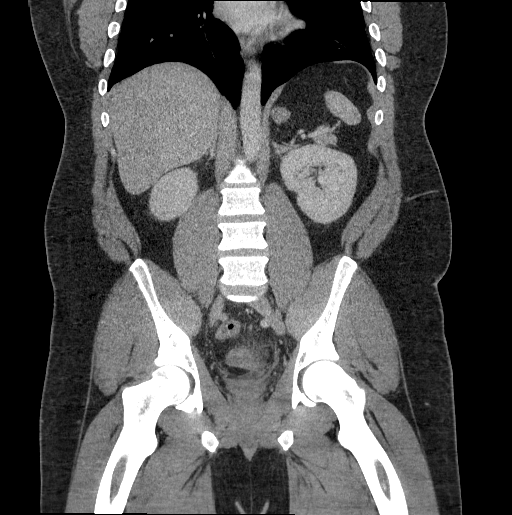

[17 of 46 positions shown; findings below may reference images not displayed]

FINDINGS: Lower chest: No acute abnormality.

Hepatobiliary: No focal liver abnormality is seen. No gallstones,
gallbladder wall thickening, or biliary dilatation.

Pancreas: Unremarkable. No pancreatic ductal dilatation or
surrounding inflammatory changes.

Spleen: Normal in size without focal abnormality.

Adrenals/Urinary Tract: Adrenal glands are unremarkable. Kidneys are
normal, without renal calculi, focal lesion, or hydronephrosis.
Bladder is unremarkable.

Stomach/Bowel: The stomach and small bowel are normal. There is fat
stranding adjacent to a thickened loop of sigmoid colon. There are a
few diverticuli in this location. There is a small focus of
extraluminal gas as seen on axial image 76 and coronal image 93
measuring up to 1.2 cm. No associated fluid in the region of the
gas. The remainder of the colon is normal. The patient appears to be
status post appendectomy.

Vascular/Lymphatic: No significant vascular findings are present. No
enlarged abdominal or pelvic lymph nodes.

Reproductive: Prostate is unremarkable.

Other: No abdominal wall hernia or abnormality. No abdominopelvic
ascites.

Musculoskeletal: No acute or significant osseous findings.
IMPRESSION: 1. Fat stranding adjacent to a thickened loop of sigmoid colon with
scattered diverticuli in this region is consistent with
diverticulitis. A 1.2 cm focus of extraluminal gas is identified
consistent with contained micro perforation. No abscess noted.
2. No other acute abnormalities.

## 2022-04-15 ENCOUNTER — Ambulatory Visit (INDEPENDENT_AMBULATORY_CARE_PROVIDER_SITE_OTHER): Payer: BC Managed Care – PPO | Admitting: Podiatry

## 2022-04-15 ENCOUNTER — Encounter: Payer: Self-pay | Admitting: Podiatry

## 2022-04-15 ENCOUNTER — Ambulatory Visit (INDEPENDENT_AMBULATORY_CARE_PROVIDER_SITE_OTHER): Payer: BC Managed Care – PPO

## 2022-04-15 ENCOUNTER — Other Ambulatory Visit: Payer: Self-pay | Admitting: Podiatry

## 2022-04-15 DIAGNOSIS — M722 Plantar fascial fibromatosis: Secondary | ICD-10-CM

## 2022-04-15 DIAGNOSIS — M778 Other enthesopathies, not elsewhere classified: Secondary | ICD-10-CM

## 2022-04-15 MED ORDER — MELOXICAM 15 MG PO TABS: 15.0000 mg | ORAL_TABLET | Freq: Every day | ORAL | 3 refills | Status: DC

## 2022-04-15 MED ORDER — TRIAMCINOLONE ACETONIDE 40 MG/ML IJ SUSP
40.0000 mg | Freq: Once | INTRAMUSCULAR | Status: AC
  Administered 2022-04-15: 40 mg

## 2022-04-15 MED ORDER — METHYLPREDNISOLONE 4 MG PO TBPK: ORAL_TABLET | ORAL | 0 refills | Status: DC

## 2022-04-15 NOTE — Progress Notes (Signed)
  Subjective:  Patient ID: Rodney Rush, male    DOB: [birth year],  MRN: [id number] HPI Chief Complaint  Patient presents with   Foot Pain    Plantar feet bilateral - aching x several years, gotten worse over time, burning and aching more frequently, AM pain, epsom salt soaks   New Patient (Initial Visit)    32 y.o. male presents with the above complaint.   ROS: Denies fever chills nausea vomit muscle aches pains calf pain back pain chest pain shortness of breath.  No past medical history on file. Past Surgical History:  Procedure Laterality Date   APPENDECTOMY      Current Outpatient Medications:    meloxicam (MOBIC) 15 MG tablet, Take 1 tablet (15 mg total) by mouth daily., Disp: 30 tablet, Rfl: 3   methylPREDNISolone (MEDROL DOSEPAK) 4 MG TBPK tablet, 6 day dose pack - take as directed, Disp: 21 tablet, Rfl: 0  Allergies  Allergen Reactions   Ibuprofen Hives   Review of Systems Objective:  There were no vitals filed for this visit.  General: Well developed, nourished, in no acute distress, alert and oriented x3   Dermatological: Skin is warm, dry and supple bilateral. Nails x 10 are well maintained; remaining integument appears unremarkable at this time. There are no open sores, no preulcerative lesions, no rash or signs of infection present.  Vascular: Dorsalis Pedis artery and Posterior Tibial artery pedal pulses are 2/4 bilateral with immedate capillary fill time. Pedal hair growth present. No varicosities and no lower extremity edema present bilateral.   Neruologic: Grossly intact via light touch bilateral. Vibratory intact via tuning fork bilateral. Protective threshold with Semmes Wienstein monofilament intact to all pedal sites bilateral. Patellar and Achilles deep tendon reflexes 2+ bilateral. No Babinski or clonus noted bilateral.   Musculoskeletal: No gross boney pedal deformities bilateral. No pain, crepitus, or limitation noted with foot and ankle range of  motion bilateral. Muscular strength 5/5 in all groups tested bilateral.  Mild hammertoe deformity bilateral.  Pain on palpation medial calcaneal tubercles bilateral pain on palpation to the fourth fifth tarsometatarsal joint left  Gait: Unassisted, Nonantalgic.    Radiographs:  Radiographs taken today demonstrate osseously mature individual good mineralization to the bone.  Mild hammertoe deformities bilateral.  Soft tissue increase in density plantar fashion calcaneal insertion sites indicative of Planter fasciitis.  No acute findings noted.  Assessment & Plan:   Assessment: Planter fasciitis with lateral compensatory syndrome.  Plan: Injected the bilateral heels today 20 mg Kenalog 5 mg Marcaine point of maximal tenderness.  Started him on methylprednisolone to be followed by meloxicam.  We are going to have orthotics made for him.  Discussed appropriate shoe gear stretching exercise ice therapy and shoe gear modifications.     Jesenya Bowditch T. [place], [place]

## 2022-04-22 ENCOUNTER — Ambulatory Visit (INDEPENDENT_AMBULATORY_CARE_PROVIDER_SITE_OTHER): Payer: BC Managed Care – PPO

## 2022-04-22 DIAGNOSIS — M722 Plantar fascial fibromatosis: Secondary | ICD-10-CM | POA: Diagnosis not present

## 2022-04-22 NOTE — Progress Notes (Signed)
Patient presents today to be casted for custom molded orthotics. Dr. Milinda Pointer has been treating patient for plantar fasciitis.   Impression foam cast was taken. ABN signed.  Patient info-  Shoe size: 13 wide men's  Shoe style: Athletic shoes  Weight: 290 lbs  Insurance: [employer]   Patient will be notified once orthotics arrive in office and reappoint for fitting at that time.

## 2022-06-17 ENCOUNTER — Ambulatory Visit: Payer: BC Managed Care – PPO | Admitting: Podiatry

## 2022-07-27 ENCOUNTER — Telehealth: Payer: Self-pay | Admitting: Podiatry

## 2022-07-27 NOTE — Telephone Encounter (Signed)
Lmom for patient to call back to schedule picking up orthotics    Balance is 132.52

## 2022-08-27 ENCOUNTER — Other Ambulatory Visit: Payer: Self-pay

## 2022-08-27 ENCOUNTER — Encounter (HOSPITAL_COMMUNITY): Payer: Self-pay | Admitting: Internal Medicine

## 2022-08-27 ENCOUNTER — Emergency Department (HOSPITAL_COMMUNITY): Payer: BC Managed Care – PPO

## 2022-08-27 ENCOUNTER — Inpatient Hospital Stay (HOSPITAL_COMMUNITY)
Admission: EM | Admit: 2022-08-27 | Discharge: 2022-08-28 | DRG: 638 | Disposition: A | Discharge status: Home / Self Care | Payer: BC Managed Care – PPO | Attending: Internal Medicine | Admitting: Internal Medicine

## 2022-08-27 ENCOUNTER — Other Ambulatory Visit (HOSPITAL_COMMUNITY): Payer: Self-pay

## 2022-08-27 DIAGNOSIS — E860 Dehydration: Secondary | ICD-10-CM | POA: Diagnosis not present

## 2022-08-27 DIAGNOSIS — I10 Essential (primary) hypertension: Secondary | ICD-10-CM | POA: Diagnosis present

## 2022-08-27 DIAGNOSIS — N179 Acute kidney failure, unspecified: Secondary | ICD-10-CM | POA: Diagnosis present

## 2022-08-27 DIAGNOSIS — Z1152 Encounter for screening for COVID-19: Secondary | ICD-10-CM | POA: Diagnosis not present

## 2022-08-27 DIAGNOSIS — Z6835 Body mass index (BMI) 35.0-35.9, adult: Secondary | ICD-10-CM

## 2022-08-27 DIAGNOSIS — Z791 Long term (current) use of non-steroidal anti-inflammatories (NSAID): Secondary | ICD-10-CM | POA: Diagnosis not present

## 2022-08-27 DIAGNOSIS — E6609 Other obesity due to excess calories: Secondary | ICD-10-CM

## 2022-08-27 DIAGNOSIS — R030 Elevated blood-pressure reading, without diagnosis of hypertension: Secondary | ICD-10-CM | POA: Diagnosis present

## 2022-08-27 DIAGNOSIS — I160 Hypertensive urgency: Secondary | ICD-10-CM | POA: Diagnosis present

## 2022-08-27 DIAGNOSIS — B370 Candidal stomatitis: Secondary | ICD-10-CM | POA: Diagnosis present

## 2022-08-27 DIAGNOSIS — Z8249 Family history of ischemic heart disease and other diseases of the circulatory system: Secondary | ICD-10-CM | POA: Diagnosis not present

## 2022-08-27 DIAGNOSIS — E1100 Type 2 diabetes mellitus with hyperosmolarity without nonketotic hyperglycemic-hyperosmolar coma (NKHHC): Principal | ICD-10-CM | POA: Diagnosis present

## 2022-08-27 DIAGNOSIS — E1165 Type 2 diabetes mellitus with hyperglycemia: Secondary | ICD-10-CM

## 2022-08-27 DIAGNOSIS — Z833 Family history of diabetes mellitus: Secondary | ICD-10-CM

## 2022-08-27 DIAGNOSIS — B379 Candidiasis, unspecified: Secondary | ICD-10-CM | POA: Diagnosis not present

## 2022-08-27 DIAGNOSIS — J069 Acute upper respiratory infection, unspecified: Secondary | ICD-10-CM

## 2022-08-27 LAB — BASIC METABOLIC PANEL
Anion gap: 13 (ref 5–15)
Anion gap: 12 (ref 5–15)
BUN: 16 mg/dL (ref 6–20)
BUN: 13 mg/dL (ref 6–20)
CO2: 17 mmol/L — ABNORMAL LOW (ref 22–32)
CO2: 20 mmol/L — ABNORMAL LOW (ref 22–32)
Calcium: 9.5 mg/dL (ref 8.9–10.3)
Calcium: 9.1 mg/dL (ref 8.9–10.3)
Chloride: 99 mmol/L (ref 98–111)
Chloride: 101 mmol/L (ref 98–111)
Creatinine, Ser: 1.16 mg/dL (ref 0.61–1.24)
Creatinine, Ser: 0.89 mg/dL (ref 0.61–1.24)
GFR, Estimated: >60 mL/min (ref >60)
GFR, Estimated: >60 mL/min (ref >60)
Glucose, Bld: 610 mg/dL (ref 70–99)
Glucose, Bld: 301 mg/dL — ABNORMAL HIGH (ref 70–99)
Potassium: 4.4 mmol/L (ref 3.5–5.1)
Potassium: 4.1 mmol/L (ref 3.5–5.1)
Sodium: 129 mmol/L — ABNORMAL LOW (ref 135–145)
Sodium: 133 mmol/L — ABNORMAL LOW (ref 135–145)

## 2022-08-27 LAB — COMPREHENSIVE METABOLIC PANEL
ALT: 22 U/L (ref 0–44)
AST: 21 U/L (ref 15–41)
Albumin: 4.1 g/dL (ref 3.5–5.0)
Alkaline Phosphatase: 96 U/L (ref 38–126)
Anion gap: 18 — ABNORMAL HIGH (ref 5–15)
BUN: 16 mg/dL (ref 6–20)
CO2: 19 mmol/L — ABNORMAL LOW (ref 22–32)
Calcium: 9.8 mg/dL (ref 8.9–10.3)
Chloride: 92 mmol/L — ABNORMAL LOW (ref 98–111)
Creatinine, Ser: 1.34 mg/dL — ABNORMAL HIGH (ref 0.61–1.24)
GFR, Estimated: >60 mL/min (ref >60)
Glucose, Bld: 828 mg/dL (ref 70–99)
Potassium: 5.2 mmol/L — ABNORMAL HIGH (ref 3.5–5.1)
Sodium: 129 mmol/L — ABNORMAL LOW (ref 135–145)
Total Bilirubin: 1.2 mg/dL (ref 0.3–1.2)
Total Protein: 7.5 g/dL (ref 6.5–8.1)

## 2022-08-27 LAB — URINALYSIS, ROUTINE W REFLEX MICROSCOPIC
Bacteria, UA: NONE SEEN
Bilirubin Urine: NEGATIVE
Glucose, UA: >=500 mg/dL — AB
Hgb urine dipstick: NEGATIVE
Ketones, ur: 20 mg/dL — AB
Leukocytes,Ua: NEGATIVE
Nitrite: NEGATIVE
Protein, ur: NEGATIVE mg/dL
Specific Gravity, Urine: 1.031 — ABNORMAL HIGH (ref 1.005–1.030)
pH: 6 (ref 5.0–8.0)

## 2022-08-27 LAB — CBC WITH DIFFERENTIAL/PLATELET
Abs Immature Granulocytes: 0.01 10*3/uL (ref 0.00–0.07)
Basophils Absolute: 0 10*3/uL (ref 0.0–0.1)
Basophils Relative: 0 %
Eosinophils Absolute: 0 10*3/uL (ref 0.0–0.5)
Eosinophils Relative: 0 %
HCT: 46.4 % (ref 39.0–52.0)
Hemoglobin: 16.4 g/dL (ref 13.0–17.0)
Immature Granulocytes: 0 %
Lymphocytes Relative: 27 %
Lymphs Abs: 2 10*3/uL (ref 0.7–4.0)
MCH: 29 pg (ref 26.0–34.0)
MCHC: 35.3 g/dL (ref 30.0–36.0)
MCV: 82.1 fL (ref 80.0–100.0)
Monocytes Absolute: 0.4 10*3/uL (ref 0.1–1.0)
Monocytes Relative: 5 %
Neutro Abs: 5.1 10*3/uL (ref 1.7–7.7)
Neutrophils Relative %: 68 %
Platelets: 395 10*3/uL (ref 150–400)
RBC: 5.65 MIL/uL (ref 4.22–5.81)
RDW: 12.4 % (ref 11.5–15.5)
WBC: 7.5 10*3/uL (ref 4.0–10.5)
nRBC: 0 % (ref 0.0–0.2)

## 2022-08-27 LAB — I-STAT VENOUS BLOOD GAS, ED
Acid-Base Excess: 0 mmol/L (ref 0.0–2.0)
Bicarbonate: 21.7 mmol/L (ref 20.0–28.0)
Calcium, Ion: 1.04 mmol/L — ABNORMAL LOW (ref 1.15–1.40)
HCT: 48 % (ref 39.0–52.0)
Hemoglobin: 16.3 g/dL (ref 13.0–17.0)
O2 Saturation: 93 %
Potassium: 5 mmol/L (ref 3.5–5.1)
Sodium: 129 mmol/L — ABNORMAL LOW (ref 135–145)
TCO2: 23 mmol/L (ref 22–32)
pCO2, Ven: 29.3 mmHg — ABNORMAL LOW (ref 44–60)
pH, Ven: 7.478 — ABNORMAL HIGH (ref 7.25–7.43)
pO2, Ven: 60 mmHg — ABNORMAL HIGH (ref 32–45)

## 2022-08-27 LAB — RESPIRATORY PANEL BY PCR

## 2022-08-27 LAB — HIV ANTIBODY (ROUTINE TESTING W REFLEX): HIV Screen 4th Generation wRfx: NONREACTIVE

## 2022-08-27 LAB — CBG MONITORING, ED
Glucose-Capillary: >600 mg/dL (ref 70–99)
Glucose-Capillary: >600 mg/dL (ref 70–99)
Glucose-Capillary: 440 mg/dL — ABNORMAL HIGH (ref 70–99)
Glucose-Capillary: 462 mg/dL — ABNORMAL HIGH (ref 70–99)
Glucose-Capillary: 287 mg/dL — ABNORMAL HIGH (ref 70–99)
Glucose-Capillary: 244 mg/dL — ABNORMAL HIGH (ref 70–99)
Glucose-Capillary: 233 mg/dL — ABNORMAL HIGH (ref 70–99)

## 2022-08-27 LAB — OSMOLALITY: Osmolality: 334 mOsm/kg (ref 275–295)

## 2022-08-27 LAB — GROUP A STREP BY PCR: Group A Strep by PCR: NOT DETECTED

## 2022-08-27 LAB — BRAIN NATRIURETIC PEPTIDE: B Natriuretic Peptide: 7.4 pg/mL (ref 0.0–100.0)

## 2022-08-27 LAB — SARS CORONAVIRUS 2 BY RT PCR: SARS Coronavirus 2 by RT PCR: NEGATIVE

## 2022-08-27 LAB — TROPONIN I (HIGH SENSITIVITY)
Troponin I (High Sensitivity): 8 ng/L (ref <18)
Troponin I (High Sensitivity): 7 ng/L (ref <18)

## 2022-08-27 LAB — GLUCOSE, CAPILLARY
Glucose-Capillary: 167 mg/dL — ABNORMAL HIGH (ref 70–99)
Glucose-Capillary: 148 mg/dL — ABNORMAL HIGH (ref 70–99)
Glucose-Capillary: 294 mg/dL — ABNORMAL HIGH (ref 70–99)
Glucose-Capillary: 221 mg/dL — ABNORMAL HIGH (ref 70–99)
Glucose-Capillary: 346 mg/dL — ABNORMAL HIGH (ref 70–99)

## 2022-08-27 LAB — BETA-HYDROXYBUTYRIC ACID: Beta-Hydroxybutyric Acid: 2.95 mmol/L — ABNORMAL HIGH (ref 0.05–0.27)

## 2022-08-27 LAB — OSMOLALITY, URINE: Osmolality, Ur: 665 mOsm/kg (ref 300–900)

## 2022-08-27 LAB — LACTIC ACID, PLASMA: Lactic Acid, Venous: 1.9 mmol/L (ref 0.5–1.9)

## 2022-08-27 MED ORDER — INSULIN STARTER KIT- PEN NEEDLES (ENGLISH)
1.0000 | Freq: Once | Status: AC
  Administered 2022-08-27: 1
  Filled 2022-08-27: qty 1

## 2022-08-27 MED ORDER — LIVING WELL WITH DIABETES BOOK
Freq: Once | Status: AC
  Administered 2022-08-27: 1
  Filled 2022-08-27: qty 1

## 2022-08-27 MED ORDER — LACTATED RINGERS IV SOLN: INTRAVENOUS | Status: DC

## 2022-08-27 MED ORDER — INSULIN ASPART 100 UNIT/ML IJ SOLN: 4.0000 [IU] | Freq: Three times a day (TID) | INTRAMUSCULAR | Status: DC

## 2022-08-27 MED ORDER — INSULIN GLARGINE-YFGN 100 UNIT/ML ~~LOC~~ SOLN
10.0000 [IU] | Freq: Every day | SUBCUTANEOUS | Status: DC
Start: 2022-08-27 — End: 2022-08-27

## 2022-08-27 MED ORDER — DEXTROSE IN LACTATED RINGERS 5 % IV SOLN: INTRAVENOUS | Status: DC

## 2022-08-27 MED ORDER — LACTATED RINGERS IV BOLUS
20.0000 mL/kg | Freq: Once | INTRAVENOUS | Status: AC
  Administered 2022-08-27: 2432 mL via INTRAVENOUS

## 2022-08-27 MED ORDER — DEXTROSE 50 % IV SOLN: 0.0000 mL | INTRAVENOUS | Status: DC | PRN

## 2022-08-27 MED ORDER — NYSTATIN 100000 UNIT/ML MT SUSP
5.0000 mL | Freq: Four times a day (QID) | OROMUCOSAL | Status: DC
  Administered 2022-08-27 – 2022-08-28 (×3): 500000 [IU] via ORAL
  Filled 2022-08-27 (×3): qty 5

## 2022-08-27 MED ORDER — INSULIN REGULAR(HUMAN) IN NACL 100-0.9 UT/100ML-% IV SOLN: INTRAVENOUS | Status: DC

## 2022-08-27 MED ORDER — ACETAMINOPHEN 325 MG PO TABS: 650.0000 mg | ORAL_TABLET | Freq: Four times a day (QID) | ORAL | Status: DC | PRN

## 2022-08-27 MED ORDER — INSULIN REGULAR(HUMAN) IN NACL 100-0.9 UT/100ML-% IV SOLN
INTRAVENOUS | Status: DC
  Administered 2022-08-27: 4 [IU]/h via INTRAVENOUS
  Filled 2022-08-27: qty 100

## 2022-08-27 MED ORDER — INSULIN ASPART 100 UNIT/ML IJ SOLN
0.0000 [IU] | Freq: Three times a day (TID) | INTRAMUSCULAR | Status: DC
  Administered 2022-08-27: 5 [IU] via SUBCUTANEOUS
  Administered 2022-08-28: 8 [IU] via SUBCUTANEOUS
  Administered 2022-08-28: 11 [IU] via SUBCUTANEOUS

## 2022-08-27 MED ORDER — POTASSIUM CHLORIDE 10 MEQ/100ML IV SOLN
10.0000 meq | INTRAVENOUS | Status: AC
  Administered 2022-08-27 (×2): 10 meq via INTRAVENOUS
  Filled 2022-08-27 (×2): qty 100

## 2022-08-27 MED ORDER — LACTATED RINGERS IV BOLUS
1000.0000 mL | Freq: Once | INTRAVENOUS | Status: AC
  Administered 2022-08-27: 1000 mL via INTRAVENOUS

## 2022-08-27 MED ORDER — ENOXAPARIN SODIUM 40 MG/0.4ML IJ SOSY
40.0000 mg | PREFILLED_SYRINGE | INTRAMUSCULAR | Status: DC
  Administered 2022-08-27 – 2022-08-28 (×2): 40 mg via SUBCUTANEOUS
  Filled 2022-08-27 (×2): qty 0.4

## 2022-08-27 MED ORDER — INSULIN ASPART 100 UNIT/ML IJ SOLN
0.0000 [IU] | Freq: Every day | INTRAMUSCULAR | Status: DC
  Administered 2022-08-27: 4 [IU] via SUBCUTANEOUS

## 2022-08-27 MED ORDER — ONDANSETRON HCL 4 MG/2ML IJ SOLN: 4.0000 mg | Freq: Four times a day (QID) | INTRAMUSCULAR | Status: DC | PRN

## 2022-08-27 MED ORDER — INSULIN GLARGINE-YFGN 100 UNIT/ML ~~LOC~~ SOLN
20.0000 [IU] | Freq: Every day | SUBCUTANEOUS | Status: DC
  Administered 2022-08-27: 20 [IU] via SUBCUTANEOUS
  Filled 2022-08-27 (×2): qty 0.2

## 2022-08-27 MED ORDER — INSULIN REGULAR(HUMAN) IN NACL 100-0.9 UT/100ML-% IV SOLN
INTRAVENOUS | Status: DC
  Administered 2022-08-27: 18 [IU]/h via INTRAVENOUS
  Filled 2022-08-27: qty 100

## 2022-08-27 MED ORDER — NYSTATIN 100000 UNIT/GM EX POWD
Freq: Two times a day (BID) | CUTANEOUS | Status: DC
  Filled 2022-08-27 (×2): qty 15

## 2022-08-27 MED ORDER — LABETALOL HCL 5 MG/ML IV SOLN
10.0000 mg | Freq: Once | INTRAVENOUS | Status: AC
  Administered 2022-08-27: 10 mg via INTRAVENOUS
  Filled 2022-08-27: qty 4

## 2022-08-27 NOTE — ED Notes (Signed)
Report attempted to be called x 2.

## 2022-08-27 NOTE — H&P (Signed)
History and Physical    Patient: Rodney Rush VWU:[id number] DOB: 1990-11-07 DOA: 08/27/2022 DOS: the patient was seen and examined on 08/27/2022 PCP: Patient, No Pcp Per  Patient coming from: Home - lives with wife and son and also his girlfriend; NOK: Wife, [phone]   Chief Complaint: polyuria  HPI: Rodney Rush is a 32 y.o. male without known medical history presenting with polyuria/polydipsia.  He reports that for the last 2 weeks he has been feeling weak, dehydrated, tongue/mouth raw, polyuria, polydipsia.  He woke up with lacerations around his genitals.  Occasional blacking out episodes.  He has never been told he has DM.    He was last admitted from 4/11-14/21 with diverticulitis.  He had elevated BP and DM that was diagnosed during the hospitalization.  He was started on captopril and was to "start Metformin at PCP follow up for DM as long as patient's abdominal pain has resolved"; he never followed up.      ER Course:  Obese, admitted in 2021 for diverticulitis and DM, didn't know he had DM or HTN and hasn't seen a doctor since.  Here with HHS vs. DKA.  Mentating ok.  +tachycardia, HTN,  Creatinine 1.34, AKI.  Started on Endotool.     Review of Systems: As mentioned in the history of present illness. All other systems reviewed and are negative. History reviewed. No pertinent past medical history. Past Surgical History:  Procedure Laterality Date   APPENDECTOMY     Social History:  reports that he has never smoked. He has never used smokeless tobacco. He reports current alcohol use. He reports that he does not use drugs.  Allergies  Allergen Reactions   Ibuprofen Hives    Family History  Problem Relation Age of Onset   Constipation Mother    Diabetes Father    Heart attack Father    Diabetes Brother    Diabetes Paternal Grandfather     Prior to Admission medications   Medication Sig Start Date End Date Taking? Authorizing Provider  meloxicam (MOBIC) 15  MG tablet Take 1 tablet (15 mg total) by mouth daily. 04/15/22   Hyatt, Max T, DPM  methylPREDNISolone (MEDROL DOSEPAK) 4 MG TBPK tablet 6 day dose pack - take as directed 04/15/22   Ernestene Kiel T, [place]    Physical Exam: Vitals:   08/27/22 1200 08/27/22 1315 08/27/22 1330 08/27/22 1525  BP: 119/77 106/75 109/77 (!) 142/104  Pulse: (!) 107 82 74   Resp: 19 19 11 18   Temp:      TempSrc:      SpO2: 98% 98% 93%   Weight:      Height:       General:  Appears calm and comfortable and is in NAD Eyes:  EOMI, normal lids, iris ENT:  grossly normal hearing, lips; tongue with whitish coating concerning for yeast; dry mm Neck:  no LAD, masses or thyromegaly Cardiovascular:  RR with mild tachycardia, no m/r/g. No LE edema.  Respiratory:   CTA bilaterally with no wheezes/rales/rhonchi.  Normal respiratory effort. Abdomen:  soft, NT, ND Penis: whitish scant drainage around the base of the mons with small excoriations, suggestive of candidal infection Skin:  no rash or induration seen on limited exam other than as above Musculoskeletal:  grossly normal tone BUE/BLE, good ROM, no bony abnormality Psychiatric:  grossly normal mood and affect, speech fluent and appropriate, AOx3 Neurologic:  CN 2-12 grossly intact, moves all extremities in coordinated fashion   Radiological Exams  on Admission: Independently reviewed - see discussion in A/P where applicable  DG Chest 2 View  Result Date: 08/27/2022 CLINICAL DATA:  32 year old male with history of cough, sore throat and shortness of breath. EXAM: CHEST - 2 VIEW COMPARISON:  Chest x-ray 10/31/2017. FINDINGS: The heart size and mediastinal contours are within normal limits. Both lungs are clear. The visualized skeletal structures are unremarkable. IMPRESSION: No active cardiopulmonary disease. Electronically Signed   By: Trudie Reed M.D.   On: 08/27/2022 07:51    EKG: Independently reviewed.  Sinus tachycardia with rate 113; nonspecific ST changes  with no evidence of acute ischemia   Labs on Admission: I have personally reviewed the available labs and imaging studies at the time of the admission.  Pertinent labs:    Glucose >600, 828, >600, 610, 462, 287, 244, 233 Na++ 129 K+ 5.2 CO2 19 BUN 16/Creatinine 1.34/GFR >60 -> 13/0.89/>60 Anion gap 18 BNP 7.4 HS troponin 8 Serum Osm 334 Urine Osm 665 Normal CBC UA: >500 glucose, 20 ketones COVID negative VBG: 7.478/29.3/21.7   Assessment and Plan: Principal Problem:   Hyperosmolar hyperglycemic state (HHS) (HCC) Active Problems:   Elevated BP without diagnosis of hypertension   AKI (acute kidney injury) (HCC)   Class 2 obesity due to excess calories with body mass index (BMI) of 35.0 to 35.9 in adult   Candidiasis    HHS -Patient with previous diagnosis of DM during hospitalization in 2021, was to start medication at the time of follow up but did not follow up and so "did not know" he was diabetic -Glucose on presentation was 828 -Slight anion gap -Normal CO2 -Patient started on the Endotool protocol in the ER -Will admit to progressive care and continue Endotool -Will plan to transition off Endotool by giving 20 units Semglee, allowing patient to eat, and then turning off insulin drip 2 hours later when parameters have been met -NPO until that happens -A1c is in process -DM coordinator consultation -Nutrition consultation  AKI -Likely profound dehydration in the setting of long-standing and worsening uncontrolled DM -Will rehydrate and follow, anticipate resolution -Avoid ACEI and NSAIDs   HTN -Previously diagnosed with HTN and treated for a brief period with Captopril, none recently -He is likely to benefit from re-initiation of ACE therapy once his creatinine has normalized -For now will cover with prn IV hydralazine  Obesity -Body mass index is 35.36 kg/m..  -Weight loss should be encouraged -Outpatient PCP/bariatric medicine f/u encouraged    Candidiasis -Appears to have oral thrush as well as genital candida -Will give PO Nystatin as well as powder  -Anticipate resolution with improvement in glucose     Advance Care Planning:   Code Status: Full Code - Code status was discussed with the patient and girlfriend at the time of admission.  The patient would want to receive full resuscitative measures at this time.   Consults: DM coordinator, nutrition, TOC team  DVT Prophylaxis: Lovenox  Family Communication: Girlfriend was present throughout evaluation  Severity of Illness: The appropriate patient status for this patient is INPATIENT. Inpatient status is judged to be reasonable and necessary in order to provide the required intensity of service to ensure the patient's safety. The patient's presenting symptoms, physical exam findings, and initial radiographic and laboratory data in the context of their chronic comorbidities is felt to place them at high risk for further clinical deterioration. Furthermore, it is not anticipated that the patient will be medically stable for discharge from the hospital within 2  midnights of admission.   * I certify that at the point of admission it is my clinical judgment that the patient will require inpatient hospital care spanning beyond 2 midnights from the point of admission due to high intensity of service, high risk for further deterioration and high frequency of surveillance required.*  Author: Jonah Blue, MD 08/27/2022 4:14 PM  For on call review [email or web address].

## 2022-08-27 NOTE — Inpatient Diabetes Management (Addendum)
Inpatient Diabetes Program Recommendations  AACE/ADA: New Consensus Statement on Inpatient Glycemic Control (2015)  Target Ranges:  Prepandial:   less than 140 mg/dL      Peak postprandial:   less than 180 mg/dL (1-2 hours)      Critically ill patients:  140 - 180 mg/dL   Lab Results  Component Value Date   GLUCAP 244 (H) 08/27/2022   HGBA1C 7.7 (H) 07/08/2019    Review of Glycemic Control  Latest Reference Range & Units 08/27/22 11:39 08/27/22 12:50 08/27/22 13:55  Glucose-Capillary 70 - 99 mg/dL 161 (H) 096 (H) 045 (H)  (H): Data is abnormally high Diabetes history: Type 2 DM Outpatient Diabetes medications: none Current orders for Inpatient glycemic control: IV insulin  Inpatient Diabetes Program Recommendations:    In agreement with current plan.   Spoke with patient and wife regarding outpatient diabetes management. Met with patient and spouse during last hospitalization in 2021 and provided resources on new onset DM. Patient never followed up.  Reports polyuria, polydipsia and weight loss.  Explained what a A1c is and what it measures, unfortunately current result still pending. Anticipate result to be elevated. Also reviewed goal A1c with patient, importance of good glucose control @ home, and blood sugar goals. Reviewed patho of DM, role of pancreas, HHS, role of insulin, survival skills, interventions, vascular changes and commorbidities.  Patient will need a meter and testing supplies at discharge. Reviewed frequency recommended and encouraged to go ahead and PCP follow up appointment. Wife states that she will make appointment.  Admits to drinking sugary beverages. Reviewed alternatives, importance of protein intake, nutritional labels and basic carb counting. Dietitian consult, LWWDM booklet, and outpatient education placed. Educated patient and spouse on insulin pen use at home. Reviewed contents of insulin flexpen starter kit. Reviewed all steps if insulin pen including  attachment of needle, 2-unit air shot, dialing up dose, giving injection, removing needle, disposal of sharps, storage of unused insulin, disposal of insulin etc. Patient unable to provide successful return demonstration; will need reinforcement. Also reviewed troubleshooting with insulin pen. MD to give patient Rxs for insulin pens and insulin pen needles.  Addendum: Noted plan to transition to include: Novolog 4 units TID and Semglee 10 units. Secure chat sent to Dr Ophelia Charter to consider further increasing Semglee to 20 units QD and Novolog 6 units TID.   Thanks, Lujean Rave, MSN, RNC-OB Diabetes Coordinator [phone] (8a-5p)

## 2022-08-27 NOTE — ED Triage Notes (Addendum)
Pt. Stated, Rodney Rush had a bad raw throat for 2 weeks, feeling weak and peeing all the time. Ive lost about 30 lbs in 2 weeks. Im dehydrated. I can't sleep. I just can't take it anymore. I wake up and its been bleeding. I will walk a little bit and get out of breath.

## 2022-08-27 NOTE — Discharge Planning (Signed)
RNCM consulted regarding PCP establishment. RNCM obtained appointment on (6/13), time (1015) with [place] and placed on After Visit Summary paperwork.  No further case management needs communicated at this time. Yacoub Diltz J. Lucretia Roers, RN, BSN, [place] [phone]

## 2022-08-27 NOTE — Progress Notes (Signed)
   08/27/22 1308  TOC Brief Assessment  Insurance and Status Reviewed  Patient has primary care physician No (Appointment with [facility] Internal Medicine department)  Home environment has been reviewed yes  Prior level of function: within functional limits, indepentdent with ADLs , A&O x 4,  Prior/Current Home Services No current home services  Social Determinants of Health Reivew SDOH reviewed no interventions necessary  Readmission risk has been reviewed Yes  Transition of care needs transition of care needs identified, TOC will continue to follow (consulted for medication assistance and PCP establishment)   Rodney Enyeart J. Lucretia Roers, RN, BSN, NCM  Transitions of Care  Nurse Case Manager  [facility] Emergency Departments  Operative Services  [phone]

## 2022-08-27 NOTE — ED Provider Notes (Signed)
[place] EMERGENCY DEPARTMENT AT [facility] Provider Note   CSN: [id number] Arrival date & time: 08/27/22  4098     History  Chief Complaint  Patient presents with   Sore Throat   Weakness   Shortness of Breath   Polydipsia   Polyuria    Rodney Rush is a 32 y.o. male.  With PMH of DM2, obesity, HTN presenting with increased generalized fatigue and weakness with polyuria and polydipsia and a reported 30 pound weight loss over the past 2 to 3 weeks.  Patient says when he was here few years ago admitted to the hospital for his stomach infection he says he was never told he had diabetes.  He says he never went home with medications nor has he been taking any medication or follow-up with a primary care doctor since discharge.  He is now here because he says his mouth feels so dry and dry despite however much water he drinks.  He has been bleeding from his tongue because it is so dry.  He has been also having polydipsia and polyuria.  Saying he pees every 10 minutes.  Denies any weight gain but endorses weight loss.  Has had no swelling in the lower extremities.  Does endorse some dyspnea on exertion and breathlessness with his generalized fatigue and weakness but no localizing weakness, no chest pain, no productive cough.  Has been having a sore throat and dry cough but no fevers or chills reported.  No vomiting, no diarrhea, no abdominal pain.   Sore Throat  Weakness      Home Medications Prior to Admission medications   Medication Sig Start Date End Date Taking? Authorizing Provider  meloxicam (MOBIC) 15 MG tablet Take 1 tablet (15 mg total) by mouth daily. 04/15/22   Hyatt, Max T, DPM  methylPREDNISolone (MEDROL DOSEPAK) 4 MG TBPK tablet 6 day dose pack - take as directed 04/15/22   Hyatt, Max T, DPM      Allergies    Ibuprofen    Review of Systems   Review of Systems  Neurological:  Positive for weakness.    Physical Exam Updated Vital Signs BP (!) 158/120    Pulse 100   Temp 99.3 F (37.4 C) (Oral)   Resp (!) 22   Ht 6\' 1"  (1.854 m)   Wt 121.6 kg   SpO2 96%   BMI 35.36 kg/m  Physical Exam Constitutional: Alert and oriented.  GCS 15, upset on exam but no acute distress Eyes: Conjunctivae are normal. ENT      Head: Normocephalic and atraumatic.      Nose: No congestion.      Mouth/Throat: Mucous membranes are dry with peeling at lips and tongue, no active bleeding, no lesions appreciated.  Uvula midline.  No exudate on oropharynx.  Mild erythema of oropharynx. Cardiovascular: S1, S2, tachycardic, equal palpable bilateral radial pulses Respiratory: Mildly tachypneic. Breath sounds are normal.  O2 sat 96 on RA Gastrointestinal: Soft and nontender.  Musculoskeletal: Normal range of motion in all extremities.      Right lower leg: No tenderness or edema.      Left lower leg: No tenderness or edema. Neurologic: Normal speech and language.  CN II through XII grossly intact.  5 out of 5 strength bilateral upper and lower extremities.   Sensation grossly intact. No gross focal neurologic deficits are appreciated. Skin: Skin is warm, dry  Psychiatric: Mood and affect are normal. Speech and behavior are normal.  ED  Results / Procedures / Treatments   Labs (all labs ordered are listed, but only abnormal results are displayed) Labs Reviewed  COMPREHENSIVE METABOLIC PANEL - Abnormal; Notable for the following components:      Result Value   Sodium 129 (*)    Potassium 5.2 (*)    Chloride 92 (*)    CO2 19 (*)    Glucose, Bld 828 (*)    Creatinine, Ser 1.34 (*)    Anion gap 18 (*)    All other components within normal limits  URINALYSIS, ROUTINE W REFLEX MICROSCOPIC - Abnormal; Notable for the following components:   Color, Urine STRAW (*)    Specific Gravity, Urine 1.031 (*)    Glucose, UA >=500 (*)    Ketones, ur 20 (*)    All other components within normal limits  OSMOLALITY - Abnormal; Notable for the following components:    Osmolality 334 (*)    All other components within normal limits  I-STAT VENOUS BLOOD GAS, ED - Abnormal; Notable for the following components:   pH, Ven 7.478 (*)    pCO2, Ven 29.3 (*)    pO2, Ven 60 (*)    Sodium 129 (*)    Calcium, Ion 1.04 (*)    All other components within normal limits  CBG MONITORING, ED - Abnormal; Notable for the following components:   Glucose-Capillary >600 (*)    All other components within normal limits  CBG MONITORING, ED - Abnormal; Notable for the following components:   Glucose-Capillary >600 (*)    All other components within normal limits  SARS CORONAVIRUS 2 BY RT PCR  GROUP A STREP BY PCR  CULTURE, BLOOD (ROUTINE X 2)  CULTURE, BLOOD (ROUTINE X 2)  RESPIRATORY PANEL BY PCR  CBC WITH DIFFERENTIAL/PLATELET  BRAIN NATRIURETIC PEPTIDE  OSMOLALITY, URINE  BASIC METABOLIC PANEL  LACTIC ACID, PLASMA  LACTIC ACID, PLASMA  HEMOGLOBIN A1C  BETA-HYDROXYBUTYRIC ACID  TROPONIN I (HIGH SENSITIVITY)  TROPONIN I (HIGH SENSITIVITY)    EKG EKG Interpretation  Date/Time:  Friday Aug 27 2022 07:14:41 EDT Ventricular Rate:  113 PR Interval:  120 QRS Duration: 90 QT Interval:  335 QTC Calculation: 460 R Axis:   77 Text Interpretation: Sinus tachycardia Nonspecific repol abnormality, inferior leads Confirmed by Vivien Rossetti ([id number]) on 08/27/2022 7:28:21 AM  Radiology DG Chest 2 View  Result Date: 08/27/2022 CLINICAL DATA:  32 year old male with history of cough, sore throat and shortness of breath. EXAM: CHEST - 2 VIEW COMPARISON:  Chest x-ray 10/31/2017. FINDINGS: The heart size and mediastinal contours are within normal limits. Both lungs are clear. The visualized skeletal structures are unremarkable. IMPRESSION: No active cardiopulmonary disease. Electronically Signed   By: Trudie Reed M.D.   On: 08/27/2022 07:51    Procedures .Critical Care  Performed by: Mardene Sayer, MD Authorized by: Mardene Sayer, MD   Critical care  provider statement:    Critical care time (minutes):  50   Critical care was necessary to treat or prevent imminent or life-threatening deterioration of the following conditions:  Metabolic crisis   Critical care was time spent personally by me on the following activities:  Development of treatment plan with patient or surrogate, evaluation of patient's response to treatment, examination of patient, ordering and review of laboratory studies, ordering and review of radiographic studies, ordering and performing treatments and interventions, pulse oximetry, re-evaluation of patient's condition, review of old charts and obtaining history from patient or surrogate   Care discussed with: admitting  provider       Medications Ordered in ED Medications  dextrose 50 % solution 0-50 mL (has no administration in time range)  insulin regular, human (MYXREDLIN) 100 units/ 100 mL infusion (18 Units/hr Intravenous New Bag/Given 08/27/22 0910)  lactated ringers infusion (has no administration in time range)  dextrose 5 % in lactated ringers infusion (has no administration in time range)  lactated ringers bolus 1,000 mL (1,000 mLs Intravenous New Bag/Given 08/27/22 0756)  lactated ringers bolus 1,000 mL (1,000 mLs Intravenous New Bag/Given 08/27/22 0911)  labetalol (NORMODYNE) injection 10 mg (10 mg Intravenous Given 08/27/22 0911)    ED Course/ Medical Decision Making/ A&P                             Medical Decision Making  Rodney Rush is a 32 y.o. male.  With PMH of DM2, obesity, HTN presenting with increased generalized fatigue and weakness with polyuria and polydipsia and a reported 30 pound weight loss over the past 2 to 3 weeks.   Patient CBG on arrival greater than 600.  EKG obtained sinus tachycardia with nonspecific T wave changes but no evidence of STEMI.  Patient also noted to be hypertensive on arrival 163/114 and tachycardic up to 115.  Patient's presentation concerning for HHS based off lab  work obtained.  However clinically he is not confused or altered.  He has no focal deficits concerning for CVA.  His CMP notable for a glucose of 828 with an anion gap of 18 and bicarbonate of 19.  However pH not acidotic at 7.478 with respiratory compensation.  His serum osmolality was high at 334.  He also was noted to have an AKI with creatinine 1.34 from baseline of approximately 0.8.  Started fluid resuscitation with 2 L IV fluid bolus as well as Endo tool for HHS.  His potassium is 5.2 so no potassium supplementation ordered.  His hypertension is concerning for hypertensive emergency, no findings concerning for emergency.  No deficits concerning for CVA.  No chest pain, no ischemic changes on EKG and reassuring high sensitive troponin I 8.  No signs of fluid overload and chest x-ray reviewed by me with no pneumonia no pulmonary edema.  Ordered first dose IV labetalol 10 mg.  D/w Dr Ophelia Charter hospitalist for admission for continued management of HHS/hypertensive urgency.   Amount and/or Complexity of Data Reviewed Labs: ordered. Radiology: ordered.  Risk Prescription drug management. Decision regarding hospitalization.   Final Clinical Impression(s) / ED Diagnoses Final diagnoses:  Dehydration  Hyperglycemic crisis due to diabetes mellitus (HCC)  AKI (acute kidney injury) (HCC)  Viral URI  Hyperosmolar hyperglycemic state (HHS) (HCC)  Hypertensive urgency    Rx / DC Orders ED Discharge Orders     None         Mardene Sayer, MD 08/27/22 [phone]

## 2022-08-27 NOTE — ED Notes (Signed)
ED TO INPATIENT HANDOFF REPORT  ED Nurse Name and Phone #:   S Name/Age/Gender Rodney Rush 32 y.o. male Room/Bed: 022C/022C  Code Status   Code Status: Full Code  Home/SNF/Other Home Patient oriented to: self, place, time, and situation Is this baseline? Yes   Triage Complete: Triage complete  Chief Complaint Hyperosmolar hyperglycemic state (HHS) (HCC) [E11.00]  Triage Note Pt. Stated, Lavenia Atlas had a bad raw throat for 2 weeks, feeling weak and peeing all the time. Ive lost about 30 lbs in 2 weeks. Im dehydrated. I can't sleep. I just can't take it anymore. I wake up and its been bleeding. I will walk a little bit and get out of breath.   Allergies Allergies  Allergen Reactions   Ibuprofen Hives    Level of Care/Admitting Diagnosis ED Disposition     ED Disposition  Admit   Condition  --   Comment  Hospital Area: [facility] [[id number]]  Level of Care: Progressive [102]  Admit to Progressive based on following criteria: GI, ENDOCRINE disease patients with GI bleeding, acute liver failure or pancreatitis, stable with diabetic ketoacidosis or thyrotoxicosis (hypothyroid) state.  May admit patient to [facility] or [facility] if equivalent level of care is available:: Yes  Covid Evaluation: Asymptomatic - no recent exposure (last 10 days) testing not required  Diagnosis: Hyperosmolar hyperglycemic state (HHS) [facility]) [[id number]]  Admitting Physician: Jonah Blue [[id number]]  Attending Physician: Jonah Blue [[id number]]  Certification:: I certify this patient will need inpatient services for at least 2 midnights  Estimated Length of Stay: 2          B Medical/Surgery History History reviewed. No pertinent past medical history. Past Surgical History:  Procedure Laterality Date   APPENDECTOMY       A IV Location/Drains/Wounds Patient Lines/Drains/Airways Status     Active Line/Drains/Airways     Name Placement date Placement time Site Days    Peripheral IV 08/27/22 20 G Posterior;Right Hand 08/27/22  0731  Hand  less than 1   Peripheral IV 08/27/22 20 G 1.88" Anterior;Left Forearm 08/27/22  0849  Forearm  less than 1            Intake/Output Last 24 hours No intake or output data in the 24 hours ending 08/27/22 1417  Labs/Imaging Results for orders placed or performed during the hospital encounter of 08/27/22 (from the past 48 hour(s))  SARS Coronavirus 2 by RT PCR (hospital order, performed in [facility] hospital lab) *cepheid single result test* Anterior Nasal Swab     Status: None   Collection Time: 08/27/22  7:12 AM   Specimen: Anterior Nasal Swab  Result Value Ref Range   SARS Coronavirus 2 by RT PCR NEGATIVE NEGATIVE    Comment: Performed at [facility] Lab, [address], [place], [name] [address]  Group A Strep by PCR     Status: None   Collection Time: 08/27/22  7:12 AM   Specimen: Throat; Sterile Swab  Result Value Ref Range   Group A Strep by PCR NOT DETECTED NOT DETECTED    Comment: Performed at [facility] Lab, [address] [place], [name] [address]  POC CBG, ED     Status: Abnormal   Collection Time: 08/27/22  7:23 AM  Result Value Ref Range   Glucose-Capillary >600 (HH) 70 - 99 mg/dL    Comment: Glucose reference range applies only to samples taken after fasting for at least 8 hours.  Comprehensive  metabolic panel     Status: Abnormal   Collection Time: 08/27/22  7:30 AM  Result Value Ref Range   Sodium 129 (L) 135 - 145 mmol/L   Potassium 5.2 (H) 3.5 - 5.1 mmol/L   Chloride 92 (L) 98 - 111 mmol/L   CO2 19 (L) 22 - 32 mmol/L   Glucose, Bld 828 (HH) 70 - 99 mg/dL    Comment: CRITICAL RESULT CALLED TO, READ BACK BY AND VERIFIED WITH KRISTA CHAPLIN EMT.@0835  ON 5.31.24 BY TCALDWELL MT. Glucose reference range applies only to samples taken after fasting for at least 8 hours.    BUN 16 6 - 20 mg/dL   Creatinine, Ser 1.61 (H) 0.61 - 1.24 mg/dL   Calcium 9.8 8.9 - 09.6 mg/dL   Total  Protein 7.5 6.5 - 8.1 g/dL   Albumin 4.1 3.5 - 5.0 g/dL   AST 21 15 - 41 U/L   ALT 22 0 - 44 U/L   Alkaline Phosphatase 96 38 - 126 U/L   Total Bilirubin 1.2 0.3 - 1.2 mg/dL   GFR, Estimated >04 >54 mL/min    Comment: (NOTE) Calculated using the CKD-EPI Creatinine Equation (2021)    Anion gap 18 (H) 5 - 15    Comment: Performed at [facility] Lab, [address], [place], [name] [address]  CBC with Differential     Status: None   Collection Time: 08/27/22  7:30 AM  Result Value Ref Range   WBC 7.5 4.0 - 10.5 K/uL   RBC 5.65 4.22 - 5.81 MIL/uL   Hemoglobin 16.4 13.0 - 17.0 g/dL   HCT 91.4 78.2 - 95.6 %   MCV 82.1 80.0 - 100.0 fL   MCH 29.0 26.0 - 34.0 pg   MCHC 35.3 30.0 - 36.0 g/dL   RDW 21.3 08.6 - 57.8 %   Platelets 395 150 - 400 K/uL   nRBC 0.0 0.0 - 0.2 %   Neutrophils Relative % 68 %   Neutro Abs 5.1 1.7 - 7.7 K/uL   Lymphocytes Relative 27 %   Lymphs Abs 2.0 0.7 - 4.0 K/uL   Monocytes Relative 5 %   Monocytes Absolute 0.4 0.1 - 1.0 K/uL   Eosinophils Relative 0 %   Eosinophils Absolute 0.0 0.0 - 0.5 K/uL   Basophils Relative 0 %   Basophils Absolute 0.0 0.0 - 0.1 K/uL   Immature Granulocytes 0 %   Abs Immature Granulocytes 0.01 0.00 - 0.07 K/uL    Comment: Performed at [facility] Lab, [address] [place], [name] [address]  Brain natriuretic peptide     Status: None   Collection Time: 08/27/22  7:30 AM  Result Value Ref Range   B Natriuretic Peptide 7.4 0.0 - 100.0 pg/mL    Comment: Performed at [facility], [address], [place], [name] [address]  Troponin I (High Sensitivity)     Status: None   Collection Time: 08/27/22  7:30 AM  Result Value Ref Range   Troponin I (High Sensitivity) 8 <18 ng/L    Comment: (NOTE) Elevated high sensitivity troponin I (hsTnI) values and significant  changes across serial measurements may suggest ACS but many other  chronic and acute conditions are known to elevate hsTnI results.  Refer to the "Links"  section for chest pain algorithms and additional  guidance. Performed at [facility] Lab, [address], [place], [name] [address]   Osmolality     Status: Abnormal   Collection  Time: 08/27/22  7:30 AM  Result Value Ref Range   Osmolality 334 (HH) 275 - 295 mOsm/kg    Comment: REPEATED TO VERIFY CRITICAL RESULT CALLED TO, READ BACK BY AND VERIFIED WITH: Called to Mikeal Hawthorne, RN @ [phone] 08/27/2022 by Lafonda Mosses Performed at [facility] Lab, [address] [place], [name] [address]   Urinalysis, Routine w reflex microscopic -Urine, Clean Catch     Status: Abnormal   Collection Time: 08/27/22  7:36 AM  Result Value Ref Range   Color, Urine STRAW (A) YELLOW   APPearance CLEAR CLEAR   Specific Gravity, Urine 1.031 (H) 1.005 - 1.030   pH 6.0 5.0 - 8.0   Glucose, UA >=500 (A) NEGATIVE mg/dL   Hgb urine dipstick NEGATIVE NEGATIVE   Bilirubin Urine NEGATIVE NEGATIVE   Ketones, ur 20 (A) NEGATIVE mg/dL   Protein, ur NEGATIVE NEGATIVE mg/dL   Nitrite NEGATIVE NEGATIVE   Leukocytes,Ua NEGATIVE NEGATIVE   RBC / HPF 0-5 0 - 5 RBC/hpf   WBC, UA 0-5 0 - 5 WBC/hpf   Bacteria, UA NONE SEEN NONE SEEN   Squamous Epithelial / HPF 0-5 0 - 5 /HPF    Comment: Performed at [facility] Lab, [address], [place], [name] [address]  I-Stat venous blood gas, [facility] ED, [facility], [facility])     Status: Abnormal   Collection Time: 08/27/22  7:38 AM  Result Value Ref Range   pH, Ven 7.478 (H) 7.25 - 7.43   pCO2, Ven 29.3 (L) 44 - 60 mmHg   pO2, Ven 60 (H) 32 - 45 mmHg   Bicarbonate 21.7 20.0 - 28.0 mmol/L   TCO2 23 22 - 32 mmol/L   O2 Saturation 93 %   Acid-Base Excess 0.0 0.0 - 2.0 mmol/L   Sodium 129 (L) 135 - 145 mmol/L   Potassium 5.0 3.5 - 5.1 mmol/L   Calcium, Ion 1.04 (L) 1.15 - 1.40 mmol/L   HCT 48.0 39.0 - 52.0 %   Hemoglobin 16.3 13.0 - 17.0 g/dL   Sample type VENOUS   Osmolality, urine     Status: None   Collection Time: 08/27/22  7:38 AM  Result Value Ref Range   Osmolality, Ur  665 300 - 900 mOsm/kg    Comment: Performed at [facility] Lab, [address] [place], [name] [address]  CBG monitoring, ED     Status: Abnormal   Collection Time: 08/27/22  8:53 AM  Result Value Ref Range   Glucose-Capillary >600 (HH) 70 - 99 mg/dL    Comment: Glucose reference range applies only to samples taken after fasting for at least 8 hours.  Lactic acid, plasma     Status: None   Collection Time: 08/27/22  9:41 AM  Result Value Ref Range   Lactic Acid, Venous 1.9 0.5 - 1.9 mmol/L    Comment: Performed at [facility] Lab, [address] [place], [name] [address]  Beta-hydroxybutyric acid     Status: Abnormal   Collection Time: 08/27/22  9:41 AM  Result Value Ref Range   Beta-Hydroxybutyric Acid 2.95 (H) 0.05 - 0.27 mmol/L    Comment: Performed at [facility] Lab, [address], [place], [name] [address]  Troponin I (High Sensitivity)     Status: None   Collection Time: 08/27/22  9:41 AM  Result Value Ref Range   Troponin I (High Sensitivity) 7 <18 ng/L    Comment: (NOTE) Elevated high sensitivity troponin I (hsTnI) values  and significant  changes across serial measurements may suggest ACS but many other  chronic and acute conditions are known to elevate hsTnI results.  Refer to the "Links" section for chest pain algorithms and additional  guidance. Performed at [facility] Lab, [address], [place], [name] [address]   Basic metabolic panel     Status: Abnormal   Collection Time: 08/27/22  9:48 AM  Result Value Ref Range   Sodium 129 (L) 135 - 145 mmol/L   Potassium 4.4 3.5 - 5.1 mmol/L   Chloride 99 98 - 111 mmol/L   CO2 17 (L) 22 - 32 mmol/L   Glucose, Bld 610 (HH) 70 - 99 mg/dL    Comment: CRITICAL RESULT CALLED TO, READ BACK BY AND VERIFIED WITH Nachman Sundt RN.@1101  ON 5.31.24 BY TCALDWELL MT. Glucose reference range applies only to samples taken after fasting for at least 8 hours.    BUN 16 6 - 20 mg/dL   Creatinine, Ser 6.04 0.61 -  1.24 mg/dL   Calcium 9.5 8.9 - 54.0 mg/dL   GFR, Estimated >98 >11 mL/min    Comment: (NOTE) Calculated using the CKD-EPI Creatinine Equation (2021)    Anion gap 13 5 - 15    Comment: Performed at [facility] Lab, [address], [place], [name] [address]  CBG monitoring, ED     Status: Abnormal   Collection Time: 08/27/22 10:14 AM  Result Value Ref Range   Glucose-Capillary 440 (H) 70 - 99 mg/dL    Comment: Glucose reference range applies only to samples taken after fasting for at least 8 hours.  CBG monitoring, ED     Status: Abnormal   Collection Time: 08/27/22 11:39 AM  Result Value Ref Range   Glucose-Capillary 462 (H) 70 - 99 mg/dL    Comment: Glucose reference range applies only to samples taken after fasting for at least 8 hours.  CBG monitoring, ED     Status: Abnormal   Collection Time: 08/27/22 12:50 PM  Result Value Ref Range   Glucose-Capillary 287 (H) 70 - 99 mg/dL    Comment: Glucose reference range applies only to samples taken after fasting for at least 8 hours.  Basic metabolic panel     Status: Abnormal   Collection Time: 08/27/22 12:59 PM  Result Value Ref Range   Sodium 133 (L) 135 - 145 mmol/L   Potassium 4.1 3.5 - 5.1 mmol/L   Chloride 101 98 - 111 mmol/L   CO2 20 (L) 22 - 32 mmol/L   Glucose, Bld 301 (H) 70 - 99 mg/dL    Comment: Glucose reference range applies only to samples taken after fasting for at least 8 hours.   BUN 13 6 - 20 mg/dL   Creatinine, Ser 2.95 0.61 - 1.24 mg/dL   Calcium 9.1 8.9 - 62.1 mg/dL   GFR, Estimated >30 >86 mL/min    Comment: (NOTE) Calculated using the CKD-EPI Creatinine Equation (2021)    Anion gap 12 5 - 15    Comment: Performed at [facility] Lab, [address] [place], [name] [address]  CBG monitoring, ED     Status: Abnormal   Collection Time: 08/27/22  1:55 PM  Result Value Ref Range   Glucose-Capillary 244 (H) 70 - 99 mg/dL    Comment: Glucose reference range applies only to samples taken after fasting  for at least 8 hours.   DG Chest 2 View  Result Date: 08/27/2022 CLINICAL DATA:  32 year old male  with history of cough, sore throat and shortness of breath. EXAM: CHEST - 2 VIEW COMPARISON:  Chest x-ray 10/31/2017. FINDINGS: The heart size and mediastinal contours are within normal limits. Both lungs are clear. The visualized skeletal structures are unremarkable. IMPRESSION: No active cardiopulmonary disease. Electronically Signed   By: Trudie Reed M.D.   On: 08/27/2022 07:51    Pending Labs Unresulted Labs (From admission, onward)     Start     Ordered   08/27/22 1330  HIV Antibody (routine testing w rflx)  Once,   R        08/27/22 1330   08/27/22 1118  Basic metabolic panel  (Hyperglycemic Hyperosmolar State (HHS))  STAT Now then every 4 hours ,   R (with STAT occurrences)      08/27/22 1119   08/27/22 0857  Hemoglobin A1c  Add-on,   AD        08/27/22 0856   08/27/22 0846  Blood culture (routine x 2)  BLOOD CULTURE X 2,   R (with STAT occurrences)      08/27/22 0845   08/27/22 0846  Respiratory (~20 pathogens) panel by PCR  (Respiratory panel by PCR (~20 pathogens, ~24 hr TAT)  w precautions)  Once,   URGENT        08/27/22 0845            Vitals/Pain Today's Vitals   08/27/22 1115 08/27/22 1200 08/27/22 1315 08/27/22 1330  BP: 124/77 119/77 106/75 109/77  Pulse: 95 (!) 107 82 74  Resp: 16 19 19 11   Temp:      TempSrc:      SpO2: 96% 98% 98% 93%  Weight:      Height:      PainSc:        Isolation Precautions No active isolations  Medications Medications  enoxaparin (LOVENOX) injection 40 mg (40 mg Subcutaneous Given 08/27/22 1156)  insulin regular, human (MYXREDLIN) 100 units/ 100 mL infusion (8 Units/hr Intravenous Rate/Dose Change 08/27/22 1356)  lactated ringers infusion (0 mLs Intravenous Stopped 08/27/22 1359)  dextrose 5 % in lactated ringers infusion ( Intravenous New Bag/Given 08/27/22 1359)  dextrose 50 % solution 0-50 mL (has no administration in time  range)  acetaminophen (TYLENOL) tablet 650 mg (has no administration in time range)  ondansetron (ZOFRAN) injection 4 mg (has no administration in time range)  lactated ringers bolus 1,000 mL (0 mLs Intravenous Stopped 08/27/22 1107)  lactated ringers bolus 1,000 mL (0 mLs Intravenous Stopped 08/27/22 1107)  labetalol (NORMODYNE) injection 10 mg (10 mg Intravenous Given 08/27/22 0911)  lactated ringers bolus 2,432 mL (0 mLs Intravenous Stopped 08/27/22 1359)  potassium chloride 10 mEq in 100 mL IVPB (0 mEq Intravenous Stopped 08/27/22 1359)    Mobility walks     Focused Assessments Renal Assessment Handoff:  Hemodialysis Schedule:  Last Hemodialysis date and time:    Restricted appendage:     R Recommendations: See Admitting Provider Note  Report given to:   Additional Notes:

## 2022-08-28 ENCOUNTER — Other Ambulatory Visit (HOSPITAL_COMMUNITY): Payer: Self-pay

## 2022-08-28 DIAGNOSIS — R030 Elevated blood-pressure reading, without diagnosis of hypertension: Secondary | ICD-10-CM

## 2022-08-28 DIAGNOSIS — N179 Acute kidney failure, unspecified: Secondary | ICD-10-CM

## 2022-08-28 DIAGNOSIS — E1100 Type 2 diabetes mellitus with hyperosmolarity without nonketotic hyperglycemic-hyperosmolar coma (NKHHC): Secondary | ICD-10-CM | POA: Diagnosis not present

## 2022-08-28 DIAGNOSIS — B379 Candidiasis, unspecified: Secondary | ICD-10-CM

## 2022-08-28 LAB — CBC WITH DIFFERENTIAL/PLATELET
Abs Immature Granulocytes: 0.02 10*3/uL (ref 0.00–0.07)
Basophils Absolute: 0 10*3/uL (ref 0.0–0.1)
Basophils Relative: 0 %
Eosinophils Absolute: 0.1 10*3/uL (ref 0.0–0.5)
Eosinophils Relative: 1 %
HCT: 39.5 % (ref 39.0–52.0)
Hemoglobin: 13.6 g/dL (ref 13.0–17.0)
Immature Granulocytes: 0 %
Lymphocytes Relative: 40 %
Lymphs Abs: 3.6 10*3/uL (ref 0.7–4.0)
MCH: 28.8 pg (ref 26.0–34.0)
MCHC: 34.4 g/dL (ref 30.0–36.0)
MCV: 83.7 fL (ref 80.0–100.0)
Monocytes Absolute: 0.5 10*3/uL (ref 0.1–1.0)
Monocytes Relative: 6 %
Neutro Abs: 4.7 10*3/uL (ref 1.7–7.7)
Neutrophils Relative %: 53 %
Platelets: 296 10*3/uL (ref 150–400)
RBC: 4.72 MIL/uL (ref 4.22–5.81)
RDW: 12.2 % (ref 11.5–15.5)
WBC: 8.9 10*3/uL (ref 4.0–10.5)
nRBC: 0 % (ref 0.0–0.2)

## 2022-08-28 LAB — GLUCOSE, CAPILLARY
Glucose-Capillary: 279 mg/dL — ABNORMAL HIGH (ref 70–99)
Glucose-Capillary: 322 mg/dL — ABNORMAL HIGH (ref 70–99)

## 2022-08-28 LAB — BASIC METABOLIC PANEL
Anion gap: 10 (ref 5–15)
BUN: 16 mg/dL (ref 6–20)
CO2: 24 mmol/L (ref 22–32)
Calcium: 8.4 mg/dL — ABNORMAL LOW (ref 8.9–10.3)
Chloride: 100 mmol/L (ref 98–111)
Creatinine, Ser: 1 mg/dL (ref 0.61–1.24)
GFR, Estimated: >60 mL/min (ref >60)
Glucose, Bld: 296 mg/dL — ABNORMAL HIGH (ref 70–99)
Potassium: 3.6 mmol/L (ref 3.5–5.1)
Sodium: 134 mmol/L — ABNORMAL LOW (ref 135–145)

## 2022-08-28 LAB — HEMOGLOBIN A1C
Hgb A1c MFr Bld: 12.2 % — ABNORMAL HIGH (ref 4.8–5.6)
Mean Plasma Glucose: 303 mg/dL

## 2022-08-28 LAB — CULTURE, BLOOD (ROUTINE X 2)
Culture: NO GROWTH
Culture: NO GROWTH

## 2022-08-28 MED ORDER — LANCET DEVICE MISC
1.0000 | Freq: Three times a day (TID) | 0 refills | Status: DC
  Filled 2022-08-28: qty 1, 30d supply, fill #0

## 2022-08-28 MED ORDER — NYSTATIN 100000 UNIT/GM EX POWD
Freq: Two times a day (BID) | CUTANEOUS | 0 refills | Status: DC
  Filled 2022-08-28: qty 15, fill #0

## 2022-08-28 MED ORDER — LANCET DEVICE MISC: 1.0000 | Freq: Three times a day (TID) | 0 refills | Status: AC

## 2022-08-28 MED ORDER — INSULIN GLARGINE 100 UNIT/ML SOLOSTAR PEN: 30.0000 [IU] | PEN_INJECTOR | Freq: Every day | SUBCUTANEOUS | 11 refills | Status: DC

## 2022-08-28 MED ORDER — INSULIN ASPART 100 UNIT/ML FLEXPEN: PEN_INJECTOR | SUBCUTANEOUS | 11 refills | Status: DC

## 2022-08-28 MED ORDER — INSULIN PEN NEEDLE 32G X 4 MM MISC: 1.0000 | 0 refills | Status: DC | PRN

## 2022-08-28 MED ORDER — BLOOD GLUCOSE MONITORING SUPPL DEVI: 1.0000 | Freq: Three times a day (TID) | 0 refills | Status: DC

## 2022-08-28 MED ORDER — METFORMIN HCL 500 MG PO TABS
500.0000 mg | ORAL_TABLET | Freq: Two times a day (BID) | ORAL | 1 refills | Status: DC
  Filled 2022-08-28: qty 60, 30d supply, fill #0

## 2022-08-28 MED ORDER — GVOKE HYPOPEN 2-PACK 1 MG/0.2ML ~~LOC~~ SOAJ
1.0000 mg | SUBCUTANEOUS | 0 refills | Status: DC | PRN
Start: 2022-08-28 — End: 2023-12-27

## 2022-08-28 MED ORDER — LANCETS MISC. MISC: 1.0000 | Freq: Three times a day (TID) | 0 refills | Status: AC

## 2022-08-28 MED ORDER — LISINOPRIL 10 MG PO TABS
10.0000 mg | ORAL_TABLET | Freq: Every day | ORAL | 1 refills | Status: DC
  Filled 2022-08-28: qty 30, 30d supply, fill #0

## 2022-08-28 MED ORDER — BLOOD GLUCOSE TEST VI STRP
1.0000 | ORAL_STRIP | Freq: Three times a day (TID) | 2 refills | Status: DC
  Filled 2022-08-28: qty 100, 34d supply, fill #0

## 2022-08-28 MED ORDER — BLOOD GLUCOSE MONITORING SUPPL DEVI
1.0000 | Freq: Three times a day (TID) | 0 refills | Status: DC
  Filled 2022-08-28: qty 1, fill #0

## 2022-08-28 MED ORDER — INSULIN ASPART 100 UNIT/ML IJ SOLN
8.0000 [IU] | Freq: Three times a day (TID) | INTRAMUSCULAR | Status: DC
  Administered 2022-08-28 (×2): 8 [IU] via SUBCUTANEOUS

## 2022-08-28 MED ORDER — LANCETS MISC. MISC
1.0000 | Freq: Three times a day (TID) | 0 refills | Status: DC
  Filled 2022-08-28: qty 100, 30d supply, fill #0

## 2022-08-28 MED ORDER — INSULIN PEN NEEDLE 32G X 4 MM MISC
1.0000 | 0 refills | Status: DC | PRN
  Filled 2022-08-28: qty 200, fill #0

## 2022-08-28 MED ORDER — FREESTYLE LIBRE 2 SENSOR MISC: 0 refills | Status: DC

## 2022-08-28 MED ORDER — BLOOD GLUCOSE TEST VI STRP: 1.0000 | ORAL_STRIP | Freq: Three times a day (TID) | 2 refills | Status: AC

## 2022-08-28 MED ORDER — NYSTATIN 100000 UNIT/ML MT SUSP: 5.0000 mL | Freq: Four times a day (QID) | OROMUCOSAL | 0 refills | Status: AC

## 2022-08-28 MED ORDER — NYSTATIN 100000 UNIT/ML MT SUSP
5.0000 mL | Freq: Four times a day (QID) | OROMUCOSAL | 0 refills | Status: DC
  Filled 2022-08-28: qty 60, 3d supply, fill #0

## 2022-08-28 MED ORDER — FREESTYLE LIBRE 2 SENSOR MISC
0 refills | Status: DC
  Filled 2022-08-28: qty 30, fill #0

## 2022-08-28 MED ORDER — METFORMIN HCL 500 MG PO TABS: 500.0000 mg | ORAL_TABLET | Freq: Two times a day (BID) | ORAL | 1 refills | Status: AC

## 2022-08-28 MED ORDER — LISINOPRIL 10 MG PO TABS: 10.0000 mg | ORAL_TABLET | Freq: Every day | ORAL | 1 refills | Status: DC

## 2022-08-28 MED ORDER — INSULIN GLARGINE-YFGN 100 UNIT/ML ~~LOC~~ SOLN
30.0000 [IU] | Freq: Every day | SUBCUTANEOUS | Status: DC
  Administered 2022-08-28: 30 [IU] via SUBCUTANEOUS
  Filled 2022-08-28: qty 0.3

## 2022-08-28 MED ORDER — NYSTATIN 100000 UNIT/GM EX POWD: Freq: Two times a day (BID) | CUTANEOUS | 0 refills | Status: AC

## 2022-08-28 MED ORDER — GVOKE HYPOPEN 2-PACK 1 MG/0.2ML ~~LOC~~ SOAJ
1.0000 mg | SUBCUTANEOUS | 0 refills | Status: DC | PRN
  Filled 2022-08-28: qty 0.4, fill #0

## 2022-08-28 MED ORDER — INSULIN GLARGINE 100 UNIT/ML SOLOSTAR PEN
30.0000 [IU] | PEN_INJECTOR | Freq: Every day | SUBCUTANEOUS | 11 refills | Status: DC
  Filled 2022-08-28: qty 15, 50d supply, fill #0

## 2022-08-28 MED ORDER — INSULIN ASPART 100 UNIT/ML FLEXPEN
PEN_INJECTOR | SUBCUTANEOUS | 11 refills | Status: DC
  Filled 2022-08-28: qty 15, fill #0

## 2022-08-28 NOTE — Discharge Instructions (Signed)

## 2022-08-28 NOTE — Discharge Summary (Signed)
PATIENT DETAILS Name: Rodney Rush Age: 32 y.o. Sex: male Date of Birth: Jul 19, 1990 MRN: [id number]. Admitting Physician: Jonah Blue, MD [id number], No Pcp Per  Admit Date: 08/27/2022 Discharge date: 08/28/2022  Recommendations for Outpatient Follow-up:  Follow up with PCP in 1-2 weeks Please obtain CMP/CBC in one week Optimize glycemic regimen.  Admitted From:  Home  Disposition: Home   Discharge Condition: good  CODE STATUS:   Code Status: Full Code   Diet recommendation:  Diet Order             Diet general           Diet Carb Modified Fluid consistency: Thin; Room service appropriate? Yes  Diet effective now                    Brief Summary: 32 year old who was diagnosed with HTN/DM-2021-lost to follow-up-presenting with hyperosmolar hyperglycemic nonketotic state.   Brief Hospital Course: Hyperosmolar hyperglycemic nonketotic state.  Resolved with IV insulin-transition to SQ insulin  DM-2 with uncontrolled hyperglycemia (A1c 12.2) CBG still uncontrolled-in spite of being on Semglee 20 units-will increase Semglee to 30 units daily-continue on SSI Will also place him on metformin on discharge Diabetic education performed by RN staff Extensive discussion with patient today-he understands hypoglycemic symptoms, understands logic of basal/bolus regimen-and seems to be fairly motivated to follow-up with his PCP.  He understands the long-term sequelae of uncontrolled diabetes-with devastating health effects leading to significant disability/mortality. To follow-up with his PCP and continue optimization office regimen.  HTN Previously on lisinopril-this will be resumed  Candidiasis Involving oral cavity/genitalia Continue nystatin on discharge.  Obesity: Estimated body mass index is 35.36 kg/m as calculated from the following:   Height as of this encounter: 6\' 1"  (1.854 m).   Weight as of this encounter: 121.6 kg.   Discharge Diagnoses:   Principal Problem:   Hyperosmolar hyperglycemic state (HHS) (HCC) Active Problems:   Elevated BP without diagnosis of hypertension   AKI (acute kidney injury) (HCC)   Class 2 obesity due to excess calories with body mass index (BMI) of 35.0 to 35.9 in adult   Candidiasis   Discharge Instructions:  Activity:  As tolerated   Discharge Instructions     Ambulatory referral to Nutrition and Diabetic Education   Complete by: As directed    Call MD for:  extreme fatigue   Complete by: As directed    Call MD for:  persistant dizziness or light-headedness   Complete by: As directed    Call MD for:  persistant nausea and vomiting   Complete by: As directed    Diet general   Complete by: As directed    Discharge instructions   Complete by: As directed    Follow with Primary MD 1-2 weeks  Please check your CBGs 3-4 times a day, keep a record of your readings and take it to your next appointment with your primary care practitioner.  Please get a complete blood count and chemistry panel checked by your Primary MD at your next visit, and again as instructed by your Primary MD.  Get Medicines reviewed and adjusted: Please take all your medications with you for your next visit with your Primary MD  Laboratory/radiological data: Please request your Primary MD to go over all hospital tests and procedure/radiological results at the follow up, please ask your Primary MD to get all Hospital records sent to his/her office.  In some cases, they will be blood work, cultures and biopsy  results pending at the time of your discharge. Please request that your primary care M.D. follows up on these results.  Also Note the following: If you experience worsening of your admission symptoms, develop shortness of breath, life threatening emergency, suicidal or homicidal thoughts you must seek medical attention immediately by calling 911 or calling your MD immediately  if symptoms less severe.  You must  read complete instructions/literature along with all the possible adverse reactions/side effects for all the Medicines you take and that have been prescribed to you. Take any new Medicines after you have completely understood and accpet all the possible adverse reactions/side effects.   Do not drive when taking Pain medications or sleeping medications (Benzodaizepines)  Do not take more than prescribed Pain, Sleep and Anxiety Medications. It is not advisable to combine anxiety,sleep and pain medications without talking with your primary care practitioner  Special Instructions: If you have smoked or chewed Tobacco  in the last 2 yrs please stop smoking, stop any regular Alcohol  and or any Recreational drug use.  Wear Seat belts while driving.  Please note: You were cared for by a hospitalist during your hospital stay. Once you are discharged, your primary care physician will handle any further medical issues. Please note that NO REFILLS for any discharge medications will be authorized once you are discharged, as it is imperative that you return to your primary care physician (or establish a relationship with a primary care physician if you do not have one) for your post hospital discharge needs so that they can reassess your need for medications and monitor your lab values.   Increase activity slowly   Complete by: As directed       Allergies as of 08/28/2022       Reactions   Ibuprofen Hives        Medication List     TAKE these medications    Blood Glucose Monitoring Suppl Devi 1 each by Does not apply route in the morning, at noon, and at bedtime. May substitute to any manufacturer covered by patient's insurance.   BLOOD GLUCOSE TEST STRIPS Strp 1 each by In Vitro route in the morning, at noon, and at bedtime. May substitute to any manufacturer covered by patient's insurance.   FreeStyle Libre 2 Sensor Misc Please use as instructed   Gvoke HypoPen 2-Pack 1 MG/0.2ML  Soaj Generic drug: Glucagon Inject 1 mg into the skin as needed for up to 2 doses (Severe low blood sugar).   insulin aspart 100 UNIT/ML FlexPen Commonly known as: NOVOLOG 0-20 Units, Subcutaneous, 3 times daily with meals CBG < 70: Implement Hypoglycemia measures, call MD CBG 70 - 120: 0 units CBG 121 - 150: 2 units CBG 151 - 200: 3 units CBG 201 - 250: 5 units CBG 251 - 300: 8 units CBG 301 - 350: 11 units CBG 351 - 400: 15 units CBG > 400: call MD   insulin glargine 100 UNIT/ML Solostar Pen Commonly known as: LANTUS Inject 30 Units into the skin daily. Start taking on: August 29, 2022   Insulin Pen Needle 32G X 4 MM Misc 1 each by Does not apply route as needed.   Lancet Device Misc 1 each by Does not apply route in the morning, at noon, and at bedtime. May substitute to any manufacturer covered by patient's insurance.   Lancets Misc. Misc 1 each by Does not apply route in the morning, at noon, and at bedtime. May substitute to any manufacturer covered  by patient's insurance.   lisinopril 10 MG tablet Commonly known as: ZESTRIL Take 1 tablet (10 mg total) by mouth daily.   metFORMIN 500 MG tablet Commonly known as: GLUCOPHAGE Take 1 tablet (500 mg total) by mouth 2 (two) times daily with a meal.   nystatin 100000 UNIT/ML suspension Commonly known as: MYCOSTATIN Take 5 mLs (500,000 Units total) by mouth 4 (four) times daily for 5 days.   nystatin powder Commonly known as: MYCOSTATIN/NYSTOP Apply topically 2 (two) times daily. Apply to penis        Follow-up Information     Nooruddin, Jason Fila, MD Follow up on 09/09/2022.   Why: Time 1015, Please arrive 15 min early to complete paperwork, PCP establishment Contact information: [address] [name] [address] [phone]                Allergies  Allergen Reactions   Ibuprofen Hives     Other Procedures/Studies: DG Chest 2 View  Result Date: 08/27/2022 CLINICAL DATA:  31 year old male with history of  cough, sore throat and shortness of breath. EXAM: CHEST - 2 VIEW COMPARISON:  Chest x-ray 10/31/2017. FINDINGS: The heart size and mediastinal contours are within normal limits. Both lungs are clear. The visualized skeletal structures are unremarkable. IMPRESSION: No active cardiopulmonary disease. Electronically Signed   By: Trudie Reed M.D.   On: 08/27/2022 07:51     TODAY-DAY OF DISCHARGE:  Subjective:   Rodney Rush today has no headache,no chest abdominal pain,no new weakness tingling or numbness, feels much better wants to go home today.   Objective:   Blood pressure (!) 149/106, pulse 74, temperature 98.9 F (37.2 C), temperature source Oral, resp. rate 18, height 6\' 1"  (1.854 m), weight 121.6 kg, SpO2 93 %. No intake or output data in the 24 hours ending 08/28/22 0945 Filed Weights   08/27/22 0713  Weight: 121.6 kg    Exam: Awake Alert, Oriented *3, No new F.N deficits, Normal affect [place].AT,PERRAL Supple Neck,No JVD, No cervical lymphadenopathy appriciated.  Symmetrical Chest wall movement, Good air movement bilaterally, CTAB RRR,No Gallops,Rubs or new Murmurs, No Parasternal Heave +ve B.Sounds, Abd Soft, Non tender, No organomegaly appriciated, No rebound -guarding or rigidity. No Cyanosis, Clubbing or edema, No new Rash or bruise   PERTINENT RADIOLOGIC STUDIES: DG Chest 2 View  Result Date: 08/27/2022 CLINICAL DATA:  32 year old male with history of cough, sore throat and shortness of breath. EXAM: CHEST - 2 VIEW COMPARISON:  Chest x-ray 10/31/2017. FINDINGS: The heart size and mediastinal contours are within normal limits. Both lungs are clear. The visualized skeletal structures are unremarkable. IMPRESSION: No active cardiopulmonary disease. Electronically Signed   By: Trudie Reed M.D.   On: 08/27/2022 07:51     PERTINENT LAB RESULTS: CBC: Recent Labs    08/27/22 0730 08/27/22 0738 08/28/22 0245  WBC 7.5  --  8.9  HGB 16.4 16.3 13.6  HCT 46.4 48.0  39.5  PLT 395  --  296   CMET CMP     Component Value Date/Time   NA 134 (L) 08/28/2022 0245   K 3.6 08/28/2022 0245   CL 100 08/28/2022 0245   CO2 24 08/28/2022 0245   GLUCOSE 296 (H) 08/28/2022 0245   BUN 16 08/28/2022 0245   CREATININE 1.00 08/28/2022 0245   CALCIUM 8.4 (L) 08/28/2022 0245   PROT 7.5 08/27/2022 0730   ALBUMIN 4.1 08/27/2022 0730   AST 21 08/27/2022 0730   ALT 22 08/27/2022 0730   ALKPHOS  96 08/27/2022 0730   BILITOT 1.2 08/27/2022 0730   GFRNONAA >60 08/28/2022 0245   GFRAA >60 07/11/2019 0500    GFR Estimated Creatinine Clearance: 144.9 mL/min (by C-G formula based on SCr of 1 mg/dL). No results for input(s): "LIPASE", "AMYLASE" in the last 72 hours. No results for input(s): "CKTOTAL", "CKMB", "CKMBINDEX", "TROPONINI" in the last 72 hours. Invalid input(s): "POCBNP" No results for input(s): "DDIMER" in the last 72 hours. Recent Labs    08/27/22 1259  HGBA1C 12.2*   No results for input(s): "CHOL", "HDL", "LDLCALC", "TRIG", "CHOLHDL", "LDLDIRECT" in the last 72 hours. No results for input(s): "TSH", "T4TOTAL", "T3FREE", "THYROIDAB" in the last 72 hours.  Invalid input(s): "FREET3" No results for input(s): "VITAMINB12", "FOLATE", "FERRITIN", "TIBC", "IRON", "RETICCTPCT" in the last 72 hours. Coags: No results for input(s): "INR" in the last 72 hours.  Invalid input(s): "PT" Microbiology: Recent Results (from the past 240 hour(s))  SARS Coronavirus 2 by RT PCR (hospital order, performed in [facility] hospital lab) *cepheid single result test* Anterior Nasal Swab     Status: None   Collection Time: 08/27/22  7:12 AM   Specimen: Anterior Nasal Swab  Result Value Ref Range Status   SARS Coronavirus 2 by RT PCR NEGATIVE NEGATIVE Final    Comment: Performed at [facility], [address] [place], [name] [address]  Group A Strep by PCR     Status: None   Collection Time: 08/27/22  7:12 AM   Specimen: Throat; Sterile Swab  Result Value Ref  Range Status   Group A Strep by PCR NOT DETECTED NOT DETECTED Final    Comment: Performed at [facility], [address] [place], [name] [address]  Respiratory (~20 pathogens) panel by PCR     Status: Abnormal   Collection Time: 08/27/22  7:33 AM   Specimen: Nasopharyngeal Swab; Respiratory  Result Value Ref Range Status   Adenovirus NOT DETECTED NOT DETECTED Final   Coronavirus 229E NOT DETECTED NOT DETECTED Final    Comment: (NOTE) The Coronavirus on the Respiratory Panel, DOES NOT test for the novel  Coronavirus (2019 nCoV)    Coronavirus HKU1 NOT DETECTED NOT DETECTED Final   Coronavirus NL63 NOT DETECTED NOT DETECTED Final   Coronavirus OC43 NOT DETECTED NOT DETECTED Final   Metapneumovirus NOT DETECTED NOT DETECTED Final   Rhinovirus / Enterovirus NOT DETECTED NOT DETECTED Final   Influenza A NOT DETECTED NOT DETECTED Final   Influenza B NOT DETECTED NOT DETECTED Final   Parainfluenza Virus 1 NOT DETECTED NOT DETECTED Final   Parainfluenza Virus 2 NOT DETECTED NOT DETECTED Final   Parainfluenza Virus 3 DETECTED (A) NOT DETECTED Final   Parainfluenza Virus 4 NOT DETECTED NOT DETECTED Final   Respiratory Syncytial Virus NOT DETECTED NOT DETECTED Final   Bordetella pertussis NOT DETECTED NOT DETECTED Final   Bordetella Parapertussis NOT DETECTED NOT DETECTED Final   Chlamydophila pneumoniae NOT DETECTED NOT DETECTED Final   Mycoplasma pneumoniae NOT DETECTED NOT DETECTED Final    Comment: Performed at [facility], [address], [place], [name] [address]  Blood culture (routine x 2)     Status: None (Preliminary result)   Collection Time: 08/27/22  8:46 AM   Specimen: BLOOD LEFT HAND  Result Value Ref Range Status   Specimen Description BLOOD LEFT HAND  Final   Special Requests   Final    BOTTLES DRAWN AEROBIC AND ANAEROBIC Blood Culture results may not be optimal due  to an excessive volume of blood received in culture bottles   Culture   Final    NO GROWTH  < 24 HOURS Performed at [facility], [address] [place], [name] [address]    Report Status PENDING  Incomplete  Blood culture (routine x 2)     Status: None (Preliminary result)   Collection Time: 08/27/22  4:16 PM   Specimen: BLOOD LEFT HAND  Result Value Ref Range Status   Specimen Description BLOOD LEFT HAND  Final   Special Requests   Final    BOTTLES DRAWN AEROBIC ONLY Blood Culture results may not be optimal due to an inadequate volume of blood received in culture bottles   Culture   Final    NO GROWTH < 24 HOURS Performed at [facility], [address], [place], [name] [address]    Report Status PENDING  Incomplete    FURTHER DISCHARGE INSTRUCTIONS:  Get Medicines reviewed and adjusted: Please take all your medications with you for your next visit with your Primary MD  Laboratory/radiological data: Please request your Primary MD to go over all hospital tests and procedure/radiological results at the follow up, please ask your Primary MD to get all Hospital records sent to his/her office.  In some cases, they will be blood work, cultures and biopsy results pending at the time of your discharge. Please request that your primary care M.D. goes through all the records of your hospital data and follows up on these results.  Also Note the following: If you experience worsening of your admission symptoms, develop shortness of breath, life threatening emergency, suicidal or homicidal thoughts you must seek medical attention immediately by calling 911 or calling your MD immediately  if symptoms less severe.  You must read complete instructions/literature along with all the possible adverse reactions/side effects for all the Medicines you take and that have been prescribed to you. Take any new Medicines after you have completely understood and accpet all the possible adverse reactions/side effects.   Do not drive when taking Pain medications or sleeping medications  (Benzodaizepines)  Do not take more than prescribed Pain, Sleep and Anxiety Medications. It is not advisable to combine anxiety,sleep and pain medications without talking with your primary care practitioner  Special Instructions: If you have smoked or chewed Tobacco  in the last 2 yrs please stop smoking, stop any regular Alcohol  and or any Recreational drug use.  Wear Seat belts while driving.  Please note: You were cared for by a hospitalist during your hospital stay. Once you are discharged, your primary care physician will handle any further medical issues. Please note that NO REFILLS for any discharge medications will be authorized once you are discharged, as it is imperative that you return to your primary care physician (or establish a relationship with a primary care physician if you do not have one) for your post hospital discharge needs so that they can reassess your need for medications and monitor your lab values.  Total Time spent coordinating discharge including counseling, education and face to face time equals greater than 30 minutes.  SignedJeoffrey Massed 08/28/2022 9:45 AM

## 2022-08-28 NOTE — Inpatient Diabetes Management (Signed)
Inpatient Diabetes Program Recommendations  AACE/ADA: New Consensus Statement on Inpatient Glycemic Control (2015)  Target Ranges:  Prepandial:   less than 140 mg/dL      Peak postprandial:   less than 180 mg/dL (1-2 hours)      Critically ill patients:  140 - 180 mg/dL   Lab Results  Component Value Date   GLUCAP 279 (H) 08/28/2022   HGBA1C 12.2 (H) 08/27/2022    Diabetes history: Type 2 DM Outpatient Diabetes medications: none Current orders for Inpatient glycemic control: Semglee 30 units QD, novolog 8 units TID, novolog 0-15 units TID & HS  Inpatient Diabetes Program Recommendations:    Discharge Recommendations: Intermediate acting recommendations: insulin isophane & regular (NOVOLIN 70/30 RELION PEN) ([facility] Only) 22 units BID  Supply/Referral recommendations: Glucometer Test strips Lancet device Lancets Pen needles - standard   Secure chat sent to RN to ensure patient is able to practice insulin injections prior to DC.   Thanks, Lujean Rave, MSN, RNC-OB Diabetes Coordinator [phone] (8a-5p)

## 2022-08-28 NOTE — Progress Notes (Signed)
Nutrition Brief Note  Consult received for new diabetes diet education.  Lab Results  Component Value Date   HGBA1C 12.2 (H) 08/27/2022   RD working remotely. Pt discharging from hospital today.   "Plate Method" and "Carbohydrate Counting for People with Diabetes" handouts added to AVS. MD placed referral for outpatient nutrition education.   Wt Readings from Last 15 Encounters:  08/27/22 121.6 kg  07/08/19 131.5 kg  10/31/17 122.5 kg  04/17/17 122.5 kg  08/07/15 125.6 kg  08/06/15 124.7 kg    Body mass index is 35.36 kg/m. Patient meets criteria for obesity based on current BMI.   Current diet order is Carb modified, no meal completions documented. Labs and medications reviewed.   Please re-consult if additional nutrition issues arise.Drusilla Kanner, RDN, LDN Clinical Nutrition

## 2022-08-29 LAB — CULTURE, BLOOD (ROUTINE X 2)

## 2022-08-30 ENCOUNTER — Other Ambulatory Visit (HOSPITAL_COMMUNITY): Payer: Self-pay

## 2022-08-30 LAB — CULTURE, BLOOD (ROUTINE X 2)

## 2022-08-31 LAB — CULTURE, BLOOD (ROUTINE X 2)

## 2022-09-01 LAB — CULTURE, BLOOD (ROUTINE X 2)

## 2022-09-09 ENCOUNTER — Ambulatory Visit: Payer: BC Managed Care – PPO | Admitting: Student

## 2022-09-16 ENCOUNTER — Telehealth: Payer: Self-pay | Admitting: Podiatry

## 2022-09-16 NOTE — Telephone Encounter (Signed)
Called pt's primary number, but name on VM did not sound like his name.

## 2022-10-08 ENCOUNTER — Emergency Department (HOSPITAL_COMMUNITY): Payer: BC Managed Care – PPO

## 2022-10-08 ENCOUNTER — Other Ambulatory Visit: Payer: Self-pay

## 2022-10-08 ENCOUNTER — Emergency Department (HOSPITAL_COMMUNITY)
Admission: EM | Admit: 2022-10-08 | Discharge: 2022-10-08 | Disposition: A | Discharge status: Home / Self Care | Payer: BC Managed Care – PPO | Attending: Student | Admitting: Student

## 2022-10-08 ENCOUNTER — Encounter (HOSPITAL_COMMUNITY): Payer: Self-pay

## 2022-10-08 DIAGNOSIS — Z79899 Other long term (current) drug therapy: Secondary | ICD-10-CM | POA: Diagnosis not present

## 2022-10-08 DIAGNOSIS — R519 Headache, unspecified: Secondary | ICD-10-CM | POA: Diagnosis not present

## 2022-10-08 DIAGNOSIS — E119 Type 2 diabetes mellitus without complications: Secondary | ICD-10-CM | POA: Insufficient documentation

## 2022-10-08 DIAGNOSIS — Y92410 Unspecified street and highway as the place of occurrence of the external cause: Secondary | ICD-10-CM | POA: Insufficient documentation

## 2022-10-08 DIAGNOSIS — M79642 Pain in left hand: Secondary | ICD-10-CM | POA: Diagnosis not present

## 2022-10-08 DIAGNOSIS — R079 Chest pain, unspecified: Secondary | ICD-10-CM | POA: Diagnosis not present

## 2022-10-08 DIAGNOSIS — Z7984 Long term (current) use of oral hypoglycemic drugs: Secondary | ICD-10-CM | POA: Diagnosis not present

## 2022-10-08 DIAGNOSIS — M542 Cervicalgia: Secondary | ICD-10-CM | POA: Insufficient documentation

## 2022-10-08 DIAGNOSIS — I10 Essential (primary) hypertension: Secondary | ICD-10-CM | POA: Insufficient documentation

## 2022-10-08 DIAGNOSIS — M79641 Pain in right hand: Secondary | ICD-10-CM | POA: Diagnosis not present

## 2022-10-08 DIAGNOSIS — Z794 Long term (current) use of insulin: Secondary | ICD-10-CM | POA: Diagnosis not present

## 2022-10-08 DIAGNOSIS — M549 Dorsalgia, unspecified: Secondary | ICD-10-CM | POA: Insufficient documentation

## 2022-10-08 HISTORY — DX: Type 2 diabetes mellitus without complications: E11.9

## 2022-10-08 HISTORY — DX: Essential (primary) hypertension: I10

## 2022-10-08 LAB — CBC WITH DIFFERENTIAL/PLATELET
Abs Immature Granulocytes: 0.01 10*3/uL (ref 0.00–0.07)
Basophils Absolute: 0 10*3/uL (ref 0.0–0.1)
Basophils Relative: 1 %
Eosinophils Absolute: 0 10*3/uL (ref 0.0–0.5)
Eosinophils Relative: 1 %
HCT: 40.3 % (ref 39.0–52.0)
Hemoglobin: 13.3 g/dL (ref 13.0–17.0)
Immature Granulocytes: 0 %
Lymphocytes Relative: 40 %
Lymphs Abs: 2.4 10*3/uL (ref 0.7–4.0)
MCH: 28.6 pg (ref 26.0–34.0)
MCHC: 33 g/dL (ref 30.0–36.0)
MCV: 86.7 fL (ref 80.0–100.0)
Monocytes Absolute: 0.4 10*3/uL (ref 0.1–1.0)
Monocytes Relative: 7 %
Neutro Abs: 3.1 10*3/uL (ref 1.7–7.7)
Neutrophils Relative %: 51 %
Platelets: 418 10*3/uL — ABNORMAL HIGH (ref 150–400)
RBC: 4.65 MIL/uL (ref 4.22–5.81)
RDW: 13.4 % (ref 11.5–15.5)
WBC: 6 10*3/uL (ref 4.0–10.5)
nRBC: 0 % (ref 0.0–0.2)

## 2022-10-08 LAB — COMPREHENSIVE METABOLIC PANEL
ALT: 27 U/L (ref 0–44)
AST: 18 U/L (ref 15–41)
Albumin: 3.9 g/dL (ref 3.5–5.0)
Alkaline Phosphatase: 75 U/L (ref 38–126)
Anion gap: 8 (ref 5–15)
BUN: 13 mg/dL (ref 6–20)
CO2: 21 mmol/L — ABNORMAL LOW (ref 22–32)
Calcium: 9.3 mg/dL (ref 8.9–10.3)
Chloride: 107 mmol/L (ref 98–111)
Creatinine, Ser: 0.88 mg/dL (ref 0.61–1.24)
GFR, Estimated: >60 mL/min (ref >60)
Glucose, Bld: 190 mg/dL — ABNORMAL HIGH (ref 70–99)
Potassium: 4.2 mmol/L (ref 3.5–5.1)
Sodium: 136 mmol/L (ref 135–145)
Total Bilirubin: 0.3 mg/dL (ref 0.3–1.2)
Total Protein: 7 g/dL (ref 6.5–8.1)

## 2022-10-08 MED ORDER — NAPROXEN 375 MG PO TABS: 375.0000 mg | ORAL_TABLET | Freq: Two times a day (BID) | ORAL | 0 refills | Status: DC

## 2022-10-08 MED ORDER — IOHEXOL 350 MG/ML SOLN
75.0000 mL | Freq: Once | INTRAVENOUS | Status: AC | PRN
  Administered 2022-10-08: 75 mL via INTRAVENOUS

## 2022-10-08 MED ORDER — FENTANYL CITRATE PF 50 MCG/ML IJ SOSY
50.0000 ug | PREFILLED_SYRINGE | Freq: Once | INTRAMUSCULAR | Status: AC
  Administered 2022-10-08: 50 ug via INTRAVENOUS
  Filled 2022-10-08: qty 1
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
## 2023-04-05 ENCOUNTER — Emergency Department (HOSPITAL_COMMUNITY)
Admission: EM | Admit: 2023-04-05 | Discharge: 2023-04-05 | Discharge status: Against Medical Advice | Payer: BC Managed Care – PPO | Source: Home / Self Care

## 2023-05-19 ENCOUNTER — Telehealth: Payer: Self-pay

## 2023-05-19 ENCOUNTER — Ambulatory Visit (INDEPENDENT_AMBULATORY_CARE_PROVIDER_SITE_OTHER): Payer: BC Managed Care – PPO

## 2023-05-19 DIAGNOSIS — M2141 Flat foot [pes planus] (acquired), right foot: Secondary | ICD-10-CM | POA: Diagnosis not present

## 2023-05-19 DIAGNOSIS — M778 Other enthesopathies, not elsewhere classified: Secondary | ICD-10-CM | POA: Diagnosis not present

## 2023-05-19 DIAGNOSIS — M722 Plantar fascial fibromatosis: Secondary | ICD-10-CM

## 2023-05-19 DIAGNOSIS — M2142 Flat foot [pes planus] (acquired), left foot: Secondary | ICD-10-CM | POA: Diagnosis not present

## 2023-05-19 NOTE — Progress Notes (Signed)
 Patient presents today to pick up custom molded foot orthotics, diagnosed with plantar fssciitis by Dr. Al Corpus.   Orthotics were dispensed and fit was satisfactory. Reviewed instructions for break-in and wear. Written instructions given to patient.  Patient will follow up as needed.   Addison Bailey CPed, CFo, CFm  Scans taken for new CFO's patient received old ones from 2024  Addison Bailey CPed CFo CFm

## 2023-05-19 NOTE — Telephone Encounter (Signed)
 Orthotics are still here that patient never was fit with from 2024 checking to see if balance is still due and then if he wants new pair when he comes in today  No balance due for inserts from 2024

## 2023-06-02 ENCOUNTER — Telehealth: Payer: Self-pay

## 2023-06-02 NOTE — Telephone Encounter (Signed)
 Does not have insurance right now, so will be calling back  to set an appt when he has his insurance set up.

## 2023-06-13 ENCOUNTER — Ambulatory Visit: Payer: BC Managed Care – PPO | Admitting: Dietician

## 2023-07-26 ENCOUNTER — Other Ambulatory Visit (HOSPITAL_COMMUNITY): Payer: Self-pay

## 2023-07-26 ENCOUNTER — Telehealth (HOSPITAL_COMMUNITY): Payer: Self-pay | Admitting: Pharmacy Technician

## 2023-07-26 NOTE — Telephone Encounter (Signed)
 Pharmacy Patient Advocate Encounter   Received notification from Inpatient Request that prior authorization for Gvoke HypoPen  1-Pack 1MG /0.2ML auto-injectors is required/requested.   Insurance verification completed.   The patient is insured through [employer] .   Per test claim: PA required; PA submitted to above mentioned insurance via CoverMyMeds Key/confirmation #/EOC [id number] Status is pending

## 2023-07-27 NOTE — Telephone Encounter (Signed)
 Pharmacy Patient Advocate Encounter  Received notification from [employer] that Prior Authorization for Gvoke HypoPen  1-Pack 1MG /0.2ML auto-injectors  has been DENIED.  Full denial letter will be uploaded to the media tab. See denial reason below.   PA #/Case ID/Reference #: [id number]

## 2023-09-12 ENCOUNTER — Other Ambulatory Visit: Payer: Self-pay

## 2023-12-27 ENCOUNTER — Other Ambulatory Visit: Payer: Self-pay

## 2023-12-27 ENCOUNTER — Inpatient Hospital Stay (HOSPITAL_COMMUNITY)
Admission: EM | Admit: 2023-12-27 | Discharge: 2023-12-29 | DRG: 638 | Disposition: A | Discharge status: Home / Self Care | Attending: Internal Medicine | Admitting: Internal Medicine

## 2023-12-27 ENCOUNTER — Encounter (HOSPITAL_COMMUNITY): Payer: Self-pay

## 2023-12-27 DIAGNOSIS — Z7984 Long term (current) use of oral hypoglycemic drugs: Secondary | ICD-10-CM

## 2023-12-27 DIAGNOSIS — I10 Essential (primary) hypertension: Secondary | ICD-10-CM | POA: Diagnosis present

## 2023-12-27 DIAGNOSIS — E871 Hypo-osmolality and hyponatremia: Secondary | ICD-10-CM | POA: Diagnosis present

## 2023-12-27 DIAGNOSIS — Z8379 Family history of other diseases of the digestive system: Secondary | ICD-10-CM | POA: Diagnosis not present

## 2023-12-27 DIAGNOSIS — Z6835 Body mass index (BMI) 35.0-35.9, adult: Secondary | ICD-10-CM | POA: Diagnosis not present

## 2023-12-27 DIAGNOSIS — Z8249 Family history of ischemic heart disease and other diseases of the circulatory system: Secondary | ICD-10-CM

## 2023-12-27 DIAGNOSIS — Z833 Family history of diabetes mellitus: Secondary | ICD-10-CM

## 2023-12-27 DIAGNOSIS — E1110 Type 2 diabetes mellitus with ketoacidosis without coma: Secondary | ICD-10-CM | POA: Diagnosis present

## 2023-12-27 DIAGNOSIS — Z7985 Long-term (current) use of injectable non-insulin antidiabetic drugs: Secondary | ICD-10-CM

## 2023-12-27 DIAGNOSIS — E1100 Type 2 diabetes mellitus with hyperosmolarity without nonketotic hyperglycemic-hyperosmolar coma (NKHHC): Secondary | ICD-10-CM | POA: Diagnosis present

## 2023-12-27 DIAGNOSIS — E785 Hyperlipidemia, unspecified: Secondary | ICD-10-CM | POA: Diagnosis present

## 2023-12-27 DIAGNOSIS — Z794 Long term (current) use of insulin: Secondary | ICD-10-CM | POA: Diagnosis not present

## 2023-12-27 DIAGNOSIS — Z79899 Other long term (current) drug therapy: Secondary | ICD-10-CM | POA: Diagnosis not present

## 2023-12-27 DIAGNOSIS — E66813 Obesity, class 3: Secondary | ICD-10-CM | POA: Diagnosis present

## 2023-12-27 LAB — URINALYSIS, ROUTINE W REFLEX MICROSCOPIC
Bacteria, UA: NONE SEEN
Bilirubin Urine: NEGATIVE
Glucose, UA: >=500 mg/dL — AB
Hgb urine dipstick: NEGATIVE
Ketones, ur: NEGATIVE mg/dL
Leukocytes,Ua: NEGATIVE
Nitrite: NEGATIVE
Protein, ur: NEGATIVE mg/dL
Specific Gravity, Urine: 1.027 (ref 1.005–1.030)
pH: 6 (ref 5.0–8.0)

## 2023-12-27 LAB — CBC
HCT: 42.3 % (ref 39.0–52.0)
HCT: 39.7 % (ref 39.0–52.0)
Hemoglobin: 15.3 g/dL (ref 13.0–17.0)
Hemoglobin: 14.2 g/dL (ref 13.0–17.0)
MCH: 29 pg (ref 26.0–34.0)
MCH: 29.2 pg (ref 26.0–34.0)
MCHC: 36.2 g/dL — ABNORMAL HIGH (ref 30.0–36.0)
MCHC: 35.8 g/dL (ref 30.0–36.0)
MCV: 80.1 fL (ref 80.0–100.0)
MCV: 81.5 fL (ref 80.0–100.0)
Platelets: 482 K/uL — ABNORMAL HIGH (ref 150–400)
Platelets: 467 K/uL — ABNORMAL HIGH (ref 150–400)
RBC: 5.28 MIL/uL (ref 4.22–5.81)
RBC: 4.87 MIL/uL (ref 4.22–5.81)
RDW: 12.2 % (ref 11.5–15.5)
RDW: 12.3 % (ref 11.5–15.5)
WBC: 7.3 K/uL (ref 4.0–10.5)
WBC: 7.4 K/uL (ref 4.0–10.5)
nRBC: 0 % (ref 0.0–0.2)
nRBC: 0 % (ref 0.0–0.2)

## 2023-12-27 LAB — COMPREHENSIVE METABOLIC PANEL WITH GFR
ALT: 29 U/L (ref 0–44)
AST: 21 U/L (ref 15–41)
Albumin: 4.1 g/dL (ref 3.5–5.0)
Alkaline Phosphatase: 104 U/L (ref 38–126)
Anion gap: 19 — ABNORMAL HIGH (ref 5–15)
BUN: 21 mg/dL — ABNORMAL HIGH (ref 6–20)
CO2: 23 mmol/L (ref 22–32)
Calcium: 9.8 mg/dL (ref 8.9–10.3)
Chloride: 82 mmol/L — ABNORMAL LOW (ref 98–111)
Creatinine, Ser: 1.4 mg/dL — ABNORMAL HIGH (ref 0.61–1.24)
GFR, Estimated: >60 mL/min (ref >60)
Glucose, Bld: 748 mg/dL (ref 70–99)
Potassium: 4.1 mmol/L (ref 3.5–5.1)
Sodium: 124 mmol/L — ABNORMAL LOW (ref 135–145)
Total Bilirubin: 0.8 mg/dL (ref 0.0–1.2)
Total Protein: 7.4 g/dL (ref 6.5–8.1)

## 2023-12-27 LAB — I-STAT VENOUS BLOOD GAS, ED
Acid-Base Excess: 4 mmol/L — ABNORMAL HIGH (ref 0.0–2.0)
Bicarbonate: 28.5 mmol/L — ABNORMAL HIGH (ref 20.0–28.0)
Calcium, Ion: 1.15 mmol/L (ref 1.15–1.40)
HCT: 45 % (ref 39.0–52.0)
Hemoglobin: 15.3 g/dL (ref 13.0–17.0)
O2 Saturation: 59 %
Potassium: 4.3 mmol/L (ref 3.5–5.1)
Sodium: 124 mmol/L — ABNORMAL LOW (ref 135–145)
TCO2: 30 mmol/L (ref 22–32)
pCO2, Ven: 43.2 mmHg — ABNORMAL LOW (ref 44–60)
pH, Ven: 7.427 (ref 7.25–7.43)
pO2, Ven: 30 mmHg — CL (ref 32–45)

## 2023-12-27 LAB — BETA-HYDROXYBUTYRIC ACID
Beta-Hydroxybutyric Acid: 1.16 mmol/L — ABNORMAL HIGH (ref 0.05–0.27)
Beta-Hydroxybutyric Acid: 0.16 mmol/L (ref 0.05–0.27)

## 2023-12-27 LAB — GLUCOSE, CAPILLARY
Glucose-Capillary: 169 mg/dL — ABNORMAL HIGH (ref 70–99)
Glucose-Capillary: 178 mg/dL — ABNORMAL HIGH (ref 70–99)
Glucose-Capillary: 180 mg/dL — ABNORMAL HIGH (ref 70–99)
Glucose-Capillary: 182 mg/dL — ABNORMAL HIGH (ref 70–99)
Glucose-Capillary: 197 mg/dL — ABNORMAL HIGH (ref 70–99)
Glucose-Capillary: 217 mg/dL — ABNORMAL HIGH (ref 70–99)

## 2023-12-27 LAB — BASIC METABOLIC PANEL WITH GFR
Anion gap: 16 — ABNORMAL HIGH (ref 5–15)
Anion gap: 13 (ref 5–15)
BUN: 18 mg/dL (ref 6–20)
BUN: 19 mg/dL (ref 6–20)
CO2: 23 mmol/L (ref 22–32)
CO2: 25 mmol/L (ref 22–32)
Calcium: 9.7 mg/dL (ref 8.9–10.3)
Calcium: 9.4 mg/dL (ref 8.9–10.3)
Chloride: 93 mmol/L — ABNORMAL LOW (ref 98–111)
Chloride: 94 mmol/L — ABNORMAL LOW (ref 98–111)
Creatinine, Ser: 1.03 mg/dL (ref 0.61–1.24)
Creatinine, Ser: 1.13 mg/dL (ref 0.61–1.24)
GFR, Estimated: >60 mL/min (ref >60)
GFR, Estimated: >60 mL/min (ref >60)
Glucose, Bld: 184 mg/dL — ABNORMAL HIGH (ref 70–99)
Glucose, Bld: 147 mg/dL — ABNORMAL HIGH (ref 70–99)
Potassium: 3.6 mmol/L (ref 3.5–5.1)
Potassium: 3.4 mmol/L — ABNORMAL LOW (ref 3.5–5.1)
Sodium: 132 mmol/L — ABNORMAL LOW (ref 135–145)
Sodium: 132 mmol/L — ABNORMAL LOW (ref 135–145)

## 2023-12-27 LAB — I-STAT CHEM 8, ED
BUN: 24 mg/dL — ABNORMAL HIGH (ref 6–20)
Calcium, Ion: 1.07 mmol/L — ABNORMAL LOW (ref 1.15–1.40)
Chloride: 87 mmol/L — ABNORMAL LOW (ref 98–111)
Creatinine, Ser: 1.2 mg/dL (ref 0.61–1.24)
Glucose, Bld: >700 mg/dL (ref 70–99)
HCT: 46 % (ref 39.0–52.0)
Hemoglobin: 15.6 g/dL (ref 13.0–17.0)
Potassium: 4.6 mmol/L (ref 3.5–5.1)
Sodium: 122 mmol/L — ABNORMAL LOW (ref 135–145)
TCO2: 21 mmol/L — ABNORMAL LOW (ref 22–32)

## 2023-12-27 LAB — CBG MONITORING, ED
Glucose-Capillary: >600 mg/dL (ref 70–99)
Glucose-Capillary: 530 mg/dL (ref 70–99)
Glucose-Capillary: 463 mg/dL — ABNORMAL HIGH (ref 70–99)
Glucose-Capillary: 487 mg/dL — ABNORMAL HIGH (ref 70–99)
Glucose-Capillary: 377 mg/dL — ABNORMAL HIGH (ref 70–99)
Glucose-Capillary: 266 mg/dL — ABNORMAL HIGH (ref 70–99)
Glucose-Capillary: 177 mg/dL — ABNORMAL HIGH (ref 70–99)
Glucose-Capillary: 187 mg/dL — ABNORMAL HIGH (ref 70–99)
Glucose-Capillary: 171 mg/dL — ABNORMAL HIGH (ref 70–99)
Glucose-Capillary: 186 mg/dL — ABNORMAL HIGH (ref 70–99)

## 2023-12-27 LAB — CREATININE, SERUM
Creatinine, Ser: 1.08 mg/dL (ref 0.61–1.24)
GFR, Estimated: >60 mL/min (ref >60)

## 2023-12-27 LAB — HIV ANTIBODY (ROUTINE TESTING W REFLEX): HIV Screen 4th Generation wRfx: NONREACTIVE

## 2023-12-27 LAB — HEMOGLOBIN A1C
Hgb A1c MFr Bld: 13 % — ABNORMAL HIGH (ref 4.8–5.6)
Mean Plasma Glucose: 326.4 mg/dL

## 2023-12-27 LAB — TROPONIN I (HIGH SENSITIVITY): Troponin I (High Sensitivity): 7 ng/L (ref <18)

## 2023-12-27 MED ORDER — ACETAMINOPHEN 325 MG PO TABS
650.0000 mg | ORAL_TABLET | Freq: Once | ORAL | Status: AC
  Administered 2023-12-27: 650 mg via ORAL
  Filled 2023-12-27: qty 2

## 2023-12-27 MED ORDER — CHLORTHALIDONE 25 MG PO TABS: 25.0000 mg | ORAL_TABLET | Freq: Every day | ORAL | Status: DC

## 2023-12-27 MED ORDER — DEXTROSE 50 % IV SOLN: 0.0000 mL | INTRAVENOUS | Status: DC | PRN

## 2023-12-27 MED ORDER — SODIUM CHLORIDE 0.9 % IV BOLUS
1000.0000 mL | Freq: Once | INTRAVENOUS | Status: AC
  Administered 2023-12-27: 1000 mL via INTRAVENOUS

## 2023-12-27 MED ORDER — INSULIN ASPART 100 UNIT/ML IJ SOLN
5.0000 [IU] | Freq: Three times a day (TID) | INTRAMUSCULAR | Status: DC
  Administered 2023-12-28 – 2023-12-29 (×5): 5 [IU] via SUBCUTANEOUS

## 2023-12-27 MED ORDER — ONDANSETRON HCL 4 MG/2ML IJ SOLN
4.0000 mg | Freq: Once | INTRAMUSCULAR | Status: AC
  Administered 2023-12-27: 4 mg via INTRAVENOUS
  Filled 2023-12-27: qty 2

## 2023-12-27 MED ORDER — LACTATED RINGERS IV SOLN: INTRAVENOUS | Status: DC

## 2023-12-27 MED ORDER — POTASSIUM CHLORIDE 10 MEQ/100ML IV SOLN
10.0000 meq | INTRAVENOUS | Status: AC
  Administered 2023-12-27 (×2): 10 meq via INTRAVENOUS
  Filled 2023-12-27 (×2): qty 100

## 2023-12-27 MED ORDER — ONDANSETRON HCL 4 MG/2ML IJ SOLN
4.0000 mg | Freq: Four times a day (QID) | INTRAMUSCULAR | Status: DC | PRN
  Administered 2023-12-27: 4 mg via INTRAVENOUS
  Filled 2023-12-27: qty 2

## 2023-12-27 MED ORDER — ENOXAPARIN SODIUM 40 MG/0.4ML IJ SOSY
40.0000 mg | PREFILLED_SYRINGE | INTRAMUSCULAR | Status: DC
  Administered 2023-12-27 – 2023-12-28 (×2): 40 mg via SUBCUTANEOUS
  Filled 2023-12-27 (×2): qty 0.4

## 2023-12-27 MED ORDER — DEXTROSE IN LACTATED RINGERS 5 % IV SOLN: INTRAVENOUS | Status: DC

## 2023-12-27 MED ORDER — INSULIN GLARGINE 100 UNIT/ML ~~LOC~~ SOLN
20.0000 [IU] | Freq: Every day | SUBCUTANEOUS | Status: DC
  Administered 2023-12-28: 20 [IU] via SUBCUTANEOUS
  Filled 2023-12-27 (×2): qty 0.2

## 2023-12-27 MED ORDER — INSULIN ASPART 100 UNIT/ML IJ SOLN
10.0000 [IU] | Freq: Once | INTRAMUSCULAR | Status: AC
  Administered 2023-12-27: 10 [IU] via SUBCUTANEOUS

## 2023-12-27 MED ORDER — INSULIN REGULAR(HUMAN) IN NACL 100-0.9 UT/100ML-% IV SOLN
INTRAVENOUS | Status: DC
  Administered 2023-12-27: 15 [IU]/h via INTRAVENOUS
  Filled 2023-12-27 (×2): qty 100

## 2023-12-27 MED ORDER — INSULIN ASPART 100 UNIT/ML IJ SOLN
0.0000 [IU] | Freq: Three times a day (TID) | INTRAMUSCULAR | Status: DC
  Administered 2023-12-28 (×2): 11 [IU] via SUBCUTANEOUS
  Administered 2023-12-29 (×2): 8 [IU] via SUBCUTANEOUS

## 2023-12-27 NOTE — Inpatient Diabetes Management (Addendum)
 Inpatient Diabetes Program Recommendations  AACE/ADA: New Consensus Statement on Inpatient Glycemic Control   Target Ranges:  Prepandial:   less than 140 mg/dL      Peak postprandial:   less than 180 mg/dL (1-2 hours)      Critically ill patients:  140 - 180 mg/dL    Latest Reference Range & Units 12/27/23 06:27 12/27/23 08:55 12/27/23 09:34  Glucose-Capillary 70 - 99 mg/dL >399 (HH) 469 (HH) 512 (H)    Latest Reference Range & Units 12/27/23 06:42 12/27/23 07:44  CO2 22 - 32 mmol/L  23  Glucose 70 - 99 mg/dL >299 (HH) 251 (HH)  Anion gap 5 - 15   19 (H)    Latest Reference Range & Units 12/27/23 07:44  Beta-Hydroxybutyric Acid 0.05 - 0.27 mmol/L 1.16 (H)    Latest Reference Range & Units 08/27/22 12:59  Hemoglobin A1C 4.8 - 5.6 % 12.2 (H)   Review of Glycemic Control  Diabetes history: DM2 Outpatient Diabetes medications:  Novolog  30 units at bedtime (ran out yesterday), Metformin  500 mg BID, Ozempic 1 mg Qweek (Monday) Current orders for Inpatient glycemic control: IV insulin   Inpatient Diabetes Program Recommendations:    Insulin : Once CBGs consistently controlled and provider is ready to transition from IV to SQ insulin  please consider ordering Lantus  20 units Q24H, CBGs Q4H, and Novolog  0-15 units Q4H.  HbgA1C: Please consider ordering an A1C to evaluate glycemic control over the past 2-3 months.  Outpatient DM: At time of discharge, please provide Rx for insulin  pens that patient is being discharged on. Patient reports he has insulin  pen needles and testing supplies; he reports he still has refills for his Metformin  and Ozempic at the pharmacy already.  Addendum 12/27/23@11 :55-Spoke with patient at bedside regarding DM control. Patient  reports that he was taking Ozempic 1 mg Qweek (Monday), Metformin  500 mg BID, and and 30 units of insulin  at night. Patient is not sure what the name of the insulin  is but he states he had not been using the insulin  for a long time but he  started using it for the past 2 weeks because his glucose was reading HI on his glucometer. Patient reports his glucose has been reading HI for 2 weeks and he has ben having symptoms of hyperglycemia (frequent urination, excessive thirst, blurry vision, extreme fatigue).  Patient reports that he ran out of insulin  yesterday and he does not have anymore at home. Patient reports he had been checking glucose daily and he has used a CGM in the past but it did not stay on well so he gave up using it and just does finger sticks. Patient reports his last A1C was 7-8% and his glucose had been running in the 100's to low 200's prior to 2 weeks ago.  Discussed importance of checking CBGs and maintaining good CBG control to prevent long-term and short-term complications. Stressed to the patient the importance of improving glycemic control to prevent further complications from uncontrolled diabetes. Discussed considering using CGM sensor again to gain more information on glucose trends; discussed using skin-tac and/or sensor coverings to help the sensor stay on.  Encouraged patient to follow up with PCP regarding DM control.  Discussed initial glucose of 748 mg/dl and using IV insulin  to get glucose down and then will be transitioned to SQ insulin  regimen.   Patient reports that he will need refills on the insulin  prescribed at discharge.  Patient verbalized understanding of information discussed and reports no further questions at this  time related to diabetes.  Called [facility] to inquire about insulin  that is filled there. Informed that patient had Novolog  pens last filled 07/24/23 and he has an prescription for Lantus  but it has never been filled.    Thanks, Earnie Gainer, RN, MSN, CDCES Diabetes Coordinator Inpatient Diabetes Program [phone] (Team Pager from 8am to 5pm)

## 2023-12-27 NOTE — Progress Notes (Addendum)
 Insulin  drip still infusing. CBG is 180. Results of BMP were potassium 3.4, anion gap 13, and glucose 147. Notified Dr. Franky to see if patient could be converted to long acting insulin . Will give Lantus  when received from the pharmacy and follow up in an hour and a half per MD.

## 2023-12-27 NOTE — ED Triage Notes (Signed)
 Pt arrived from home via POV c/o high blood sugar for days. Pt states that he does not feel well. C/o abd cramping and emesis

## 2023-12-27 NOTE — Progress Notes (Signed)
 Patient had BMP drawn at 1834 that never resulted. I called the lab tech who was under the impression that the order was cancelled so the specimen was never ran. Asked lab tech to please add it on now.

## 2023-12-27 NOTE — ED Notes (Signed)
 Endotool check: stay at 15units/hr

## 2023-12-27 NOTE — ED Provider Notes (Signed)
 [place] EMERGENCY DEPARTMENT AT [facility] Provider Note   CSN: [id number] Arrival date & time: 12/27/23  9383     Patient presents with: Hyperglycemia   Rodney Rush is a 33 y.o. male.  33 year old male presents ED with complaints of hyperglycemia.  Patient advises he woke up this morning and was came to the hospital because he was feeling very fatigued with excessive thirst and urination.  Patient also endorses some intermittent blurry vision throughout the past couple weeks.  Patient advised he did not take his medication this morning.  He is prescribed metformin  and Ozempic.  Patient reportedly has sliding scale insulin  at home and incidence of hyperglycemia which he is attempted to use this week as well.  Patient reports he has been checking his sugar regularly and all this week it has read high .  Patient reports he has just been peeing a lot and been a little tired but today he decided to come in because of the excessive fatigue.  Patient denies any current pain shortness of breath chest pain headache.  Patient is established with [facility] primary care.     Prior to Admission medications   Medication Sig Start Date End Date Taking? Authorizing Provider  chlorthalidone (HYGROTON) 25 MG tablet Take 25 mg by mouth daily.   Yes [provider]  Fluocinolone Acetonide Body 0.01 % OIL Apply 1 Application topically daily as needed (itching).   Yes [provider]  insulin  glargine (LANTUS ) 100 UNIT/ML Solostar Pen Inject 30 Units into the skin daily. 08/29/22  Yes Ghimire, Donalda HERO, MD  lisinopril -hydrochlorothiazide (ZESTORETIC) 20-25 MG tablet Take 1 tablet by mouth daily.   Yes [provider]  metFORMIN  (GLUCOPHAGE ) 500 MG tablet Take 1 tablet (500 mg total) by mouth 2 (two) times daily with a meal. Patient taking differently: Take 1,000 mg by mouth 2 (two) times daily with a meal. 08/28/22  Yes Ghimire, Donalda HERO, MD  nystatin   (MYCOSTATIN /NYSTOP ) powder Apply topically 2 (two) times daily. Apply to penis 08/28/22  Yes Ghimire, Donalda HERO, MD  Semaglutide, 1 MG/DOSE, (OZEMPIC, 1 MG/DOSE,) 4 MG/3ML SOPN Inject 1 mg into the skin every 7 (seven) days.   Yes [provider]    Allergies: Patient has no known allergies.    Review of Systems  Constitutional:  Positive for fatigue.  Eyes:  Positive for visual disturbance.  Endocrine: Positive for polydipsia and polyuria.  All other systems reviewed and are negative.   Updated Vital Signs BP (!) 131/95 (BP Location: Left Arm)   Pulse 89   Temp (!) 97.5 F (36.4 C) (Oral)   Resp 18   Ht 6' (1.829 m)   Wt 117.9 kg   SpO2 99%   BMI 35.26 kg/m   Physical Exam Vitals and nursing note reviewed.  Constitutional:      Appearance: Normal appearance.  HENT:     Head: Normocephalic and atraumatic.     Nose: Nose normal.     Mouth/Throat:     Mouth: Mucous membranes are dry.  Eyes:     Extraocular Movements: Extraocular movements intact.     Conjunctiva/sclera: Conjunctivae normal.     Pupils: Pupils are equal, round, and reactive to light.  Cardiovascular:     Rate and Rhythm: Normal rate.     Heart sounds: Normal heart sounds.  Pulmonary:     Effort: Pulmonary effort is normal. No respiratory distress.     Breath sounds: Normal breath sounds.  Abdominal:  Tenderness: There is no abdominal tenderness. There is no guarding.  Musculoskeletal:        General: Normal range of motion.     Cervical back: Normal range of motion.  Skin:    General: Skin is warm.     Capillary Refill: Capillary refill takes less than 2 seconds.  Neurological:     General: No focal deficit present.     Mental Status: He is alert.  Psychiatric:        Mood and Affect: Mood normal.        Behavior: Behavior normal.     (all labs ordered are listed, but only abnormal results are displayed) Labs Reviewed  CBC - Abnormal; Notable for the following components:       Result Value   MCHC 36.2 (*)    Platelets 482 (*)    All other components within normal limits  URINALYSIS, ROUTINE W REFLEX MICROSCOPIC - Abnormal; Notable for the following components:   Color, Urine STRAW (*)    Glucose, UA >=500 (*)    All other components within normal limits  COMPREHENSIVE METABOLIC PANEL WITH GFR - Abnormal; Notable for the following components:   Sodium 124 (*)    Chloride 82 (*)    Glucose, Bld 748 (*)    BUN 21 (*)    Creatinine, Ser 1.40 (*)    Anion gap 19 (*)    All other components within normal limits  BETA-HYDROXYBUTYRIC ACID - Abnormal; Notable for the following components:   Beta-Hydroxybutyric Acid 1.16 (*)    All other components within normal limits  CBC - Abnormal; Notable for the following components:   Platelets 467 (*)    All other components within normal limits  CBG MONITORING, ED - Abnormal; Notable for the following components:   Glucose-Capillary >600 (*)    All other components within normal limits  CBG MONITORING, ED - Abnormal; Notable for the following components:   Glucose-Capillary 530 (*)    All other components within normal limits  I-STAT CHEM 8, ED - Abnormal; Notable for the following components:   Sodium 122 (*)    Chloride 87 (*)    BUN 24 (*)    Glucose, Bld >700 (*)    Calcium, Ion 1.07 (*)    TCO2 21 (*)    All other components within normal limits  I-STAT VENOUS BLOOD GAS, ED - Abnormal; Notable for the following components:   pCO2, Ven 43.2 (*)    pO2, Ven 30 (*)    Bicarbonate 28.5 (*)    Acid-Base Excess 4.0 (*)    Sodium 124 (*)    All other components within normal limits  CBG MONITORING, ED - Abnormal; Notable for the following components:   Glucose-Capillary 487 (*)    All other components within normal limits  CBG MONITORING, ED - Abnormal; Notable for the following components:   Glucose-Capillary 463 (*)    All other components within normal limits  CBG MONITORING, ED - Abnormal; Notable for the  following components:   Glucose-Capillary 377 (*)    All other components within normal limits  CREATININE, SERUM  HIV ANTIBODY (ROUTINE TESTING W REFLEX)  TROPONIN I (HIGH SENSITIVITY)    EKG: None  Radiology: No results found.   Procedures   Medications Ordered in the ED  insulin  regular, human (MYXREDLIN ) 100 units/ 100 mL infusion (12 Units/hr Intravenous Rate/Dose Change 12/27/23 1049)  lactated ringers  infusion ( Intravenous New Bag/Given 12/27/23 1008)  dextrose  5 %  in lactated ringers  infusion (has no administration in time range)  dextrose  50 % solution 0-50 mL (has no administration in time range)  potassium chloride  10 mEq in 100 mL IVPB (10 mEq Intravenous New Bag/Given 12/27/23 1007)  enoxaparin  (LOVENOX ) injection 40 mg (40 mg Subcutaneous Given 12/27/23 0937)  sodium chloride  0.9 % bolus 1,000 mL (0 mLs Intravenous Stopped 12/27/23 0848)  insulin  aspart (novoLOG ) injection 10 Units (10 Units Subcutaneous Given 12/27/23 0717)  ondansetron  (ZOFRAN ) injection 4 mg (4 mg Intravenous Given 12/27/23 0726)  sodium chloride  0.9 % bolus 1,000 mL (0 mLs Intravenous Stopped 12/27/23 1012)    33 y.o. male presents to the ED with complaints of hyperglycemia, polydipsia, polyuria, blurry vision, fatigue, this involves an extensive number of treatment options, and is a complaint that carries with it a high risk of complications and morbidity.  The differential diagnosis includes HHS, DKA, hyperglycemia, (Ddx)  On arrival pt is nontoxic, vitals mildly hypertensive with history of hypertension..  On exam patient is reporting fatigue.   I ordered medication Zofran  and insulin  for hyperglycemia  Lab Tests:  I Ordered, reviewed, and interpreted labs, which included: CMP, CBC, UA, i-STAT CHEM, CBG monitoring, venous blood gas, beta hydroxybutyric acid,   ED Course:   33 year old male presents to ED POV for complaints of hyperglycemia for approximately 1 week.  Patient sitting comfortably  in ED bed in no obvious distress nontoxic-appearing.  Patient advises his glucose meter has been reading high for the last week.  He advises he has been taking his medication prescribed except for this morning because he felt excessively fatigued and decided to come in.  On exam patient is nondiaphoretic on initial exam.  EOM's intact without blurry vision.  No pain to palpation in the abdomen and no obvious swelling in lower extremities.  Lungs are clear to auscultation all fields and vitals are unremarkable on initial exam.  Patient is reporting excessive thirst and is given water.  Patient was started on insulin  and fluid awaiting labs.  CMP had glucose of 48 with anion gap of 19.  Beta hydroxybutyric acid of 1.16. Patient was started on insulin  with lactated ringer .  Hospitalist will be consulted for admission.  Patient was admitted to Dr. Georgina for further management.   Portions of this note were generated with Scientist, clinical (histocompatibility and immunogenetics). Dictation errors may occur despite best attempts at proofreading.   Final diagnoses:  Diabetic ketoacidosis without coma associated with type 2 diabetes mellitus [facility])    ED Discharge Orders     None          Myriam Fonda GORMAN DEVONNA 12/27/23 1530    Laurice Maude BROCKS, MD 12/28/23 [phone]

## 2023-12-27 NOTE — ED Notes (Signed)
 CCMD called to place the patient on cardiac monitoring services.

## 2023-12-27 NOTE — Progress Notes (Signed)
 Patient on insulin  drip and NPO. C/O nausea and headache. Zofran  given for nausea. Notified Dr. Franky who ordered Tylenol  for headache. Patient seems understandably aggravated because he can't eat.

## 2023-12-27 NOTE — ED Notes (Addendum)
 Pt reporting chest and shoulder pain starting approximately 15 minutes ago. EKG obtained, getting labs, and MD notified.

## 2023-12-27 NOTE — H&P (Addendum)
 History and Physical    Patient: Rodney Rush FMW:[id number] DOB: 1990/08/09 DOA: 12/27/2023 DOS: the patient was seen and examined on 12/27/2023 PCP: Shannon, Jaimie, NP  Patient coming from: Home  Chief Complaint:  Chief Complaint  Patient presents with   Hyperglycemia   HPI: Rodney Rush is a 33 y.o. male with medical history significant of HTN and DM2 p/w HHS (prior admission in 08/2202 for similar).  The patient, who was diagnosed with diabetes last year, presented with flu-like symptoms that began approximately two and a half weeks ago. The patient reported experiencing shakes, vomiting, and dark mucus production. Initially, the patient suspected COVID-19, but a test performed yesterday returned negative. The patient had considered the possibility of a stomach bug or an adverse reaction to Ozempic. Despite the use of insulin  and metformin  to manage blood sugar levels, the patient experienced persistent fatigue, blurry vision, and elevated blood sugar readings (undetectable/high on home meter) from the end of last week, specifically on Wednesday. The patient attempted to alleviate symptoms with orange juice and vitamin C, but subsequently felt worse. This morning, the patient felt too unwell to work and decided to seek medical attention. The patient noted that blood sugar levels had been generally well-controlled, often around the 150-200s, but recently spiked.  In the ED, pt AFVSS. Labs notable for glucose 748, Na 124, and Cr 1.4-->1.03. EDP started Endotool for insulin  mgt and PCU medicine admission for HHS.    Review of Systems: As mentioned in the history of present illness. All other systems reviewed and are negative. Past Medical History:  Diagnosis Date   Diabetes mellitus without complication (HCC)    Hypertension    Past Surgical History:  Procedure Laterality Date   APPENDECTOMY     Social History:  reports that he has never smoked. He has never used smokeless  tobacco. He reports current alcohol use. He reports that he does not use drugs.  No Known Allergies  Family History  Problem Relation Age of Onset   Constipation Mother    Diabetes Father    Heart attack Father    Diabetes Brother    Diabetes Paternal Grandfather     Prior to Admission medications   Medication Sig Start Date End Date Taking? Authorizing Provider  chlorthalidone (HYGROTON) 25 MG tablet Take 25 mg by mouth daily.   Yes [provider]  Fluocinolone Acetonide Body 0.01 % OIL Apply 1 Application topically daily as needed (itching).   Yes [provider]  insulin  glargine (LANTUS ) 100 UNIT/ML Solostar Pen Inject 30 Units into the skin daily. 08/29/22  Yes Ghimire, Donalda HERO, MD  lisinopril -hydrochlorothiazide (ZESTORETIC) 20-25 MG tablet Take 1 tablet by mouth daily.   Yes [provider]  metFORMIN  (GLUCOPHAGE ) 500 MG tablet Take 1 tablet (500 mg total) by mouth 2 (two) times daily with a meal. Patient taking differently: Take 1,000 mg by mouth 2 (two) times daily with a meal. 08/28/22  Yes Ghimire, Donalda HERO, MD  nystatin  (MYCOSTATIN /NYSTOP ) powder Apply topically 2 (two) times daily. Apply to penis 08/28/22  Yes Ghimire, Donalda HERO, MD  Semaglutide, 1 MG/DOSE, (OZEMPIC, 1 MG/DOSE,) 4 MG/3ML SOPN Inject 1 mg into the skin every 7 (seven) days.   Yes [provider]    Physical Exam: Vitals:   12/27/23 1323 12/27/23 1345 12/27/23 1400 12/27/23 1525  BP: 112/81 110/82 109/88   Pulse: 83 80 93   Resp: 18 16 18    Temp:    98.2 F (36.8  C)  TempSrc:    Oral  SpO2: 100% 98% 100%   Weight:      Height:       General: Alert, oriented x3, resting comfortably in no acute distress HEENT: EOMI, oropharynx clear, moist mucous membranes, hearing intact Neck: Trachea midline and no gross thyromegaly Respiratory: Lungs clear to auscultation bilaterally with normal respiratory effort; no w/r/r Cardiovascular: Regular rate and rhythm w/o  m/r/g Abdomen: Soft, nontender, nondistended. Positive bowel sounds MSK: No obvious joint deformities or swelling Skin: No obvious rashes or lesions Neurologic: Awake, alert, spontaneously moves all extremities, strength intact Psychiatric: Appropriate mood and affect, conversational and cooperative   Data Reviewed:  Lab Results  Component Value Date   WBC 7.4 12/27/2023   HGB 14.2 12/27/2023   HCT 39.7 12/27/2023   MCV 81.5 12/27/2023   PLT 467 (H) 12/27/2023   Lab Results  Component Value Date   GLUCOSE 184 (H) 12/27/2023   CALCIUM 9.7 12/27/2023   NA 132 (L) 12/27/2023   K 3.6 12/27/2023   CO2 23 12/27/2023   CL 93 (L) 12/27/2023   BUN 18 12/27/2023   CREATININE 1.03 12/27/2023   Lab Results  Component Value Date   ALT 29 12/27/2023   AST 21 12/27/2023   ALKPHOS 104 12/27/2023   BILITOT 0.8 12/27/2023   No results found for: INR, PROTIME Radiology: No results found.   Assessment and Plan: 15M h/o HTN and DM2 p/w HHS (prior admission in 08/2202 for similar).  HHS -Diabetes coordinator consulted; apprec eval/recs; will need OP Endocrine consult -IV insulin  per Endotool protocol for now -NPO -F/u A1c  HTN On Lisinopril -hydrochlorothiazide but with no refill since April; thus will hold for now -Will need ACEi prior to d/c given DM above; re-consider starting pta combo pill above    Advance Care Planning:   Code Status: Full Code   Consults: N/A  Family Communication: Spouse  Severity of Illness: The appropriate patient status for this patient is INPATIENT. Inpatient status is judged to be reasonable and necessary in order to provide the required intensity of service to ensure the patient's safety. The patient's presenting symptoms, physical exam findings, and initial radiographic and laboratory data in the context of their chronic comorbidities is felt to place them at high risk for further clinical deterioration. Furthermore, it is not anticipated that  the patient will be medically stable for discharge from the hospital within 2 midnights of admission.   * I certify that at the point of admission it is my clinical judgment that the patient will require inpatient hospital care spanning beyond 2 midnights from the point of admission due to high intensity of service, high risk for further deterioration and high frequency of surveillance required.*   ------- I spent 56 minutes reviewing previous notes, at the bedside counseling/discussing the treatment plan, and performing clinical documentation.  Author: Marsha Ada, MD 12/27/2023 4:34 PM  For on call review [email or web address].

## 2023-12-28 DIAGNOSIS — E1110 Type 2 diabetes mellitus with ketoacidosis without coma: Secondary | ICD-10-CM | POA: Diagnosis not present

## 2023-12-28 LAB — BASIC METABOLIC PANEL WITH GFR
Anion gap: 13 (ref 5–15)
Anion gap: 12 (ref 5–15)
BUN: 25 mg/dL — ABNORMAL HIGH (ref 6–20)
BUN: 22 mg/dL — ABNORMAL HIGH (ref 6–20)
CO2: 24 mmol/L (ref 22–32)
CO2: 24 mmol/L (ref 22–32)
Calcium: 8.5 mg/dL — ABNORMAL LOW (ref 8.9–10.3)
Calcium: 8.9 mg/dL (ref 8.9–10.3)
Chloride: 92 mmol/L — ABNORMAL LOW (ref 98–111)
Chloride: 91 mmol/L — ABNORMAL LOW (ref 98–111)
Creatinine, Ser: 1.26 mg/dL — ABNORMAL HIGH (ref 0.61–1.24)
Creatinine, Ser: 1.18 mg/dL (ref 0.61–1.24)
GFR, Estimated: >60 mL/min (ref >60)
GFR, Estimated: >60 mL/min (ref >60)
Glucose, Bld: 289 mg/dL — ABNORMAL HIGH (ref 70–99)
Glucose, Bld: 337 mg/dL — ABNORMAL HIGH (ref 70–99)
Potassium: 3.3 mmol/L — ABNORMAL LOW (ref 3.5–5.1)
Potassium: 3.7 mmol/L (ref 3.5–5.1)
Sodium: 129 mmol/L — ABNORMAL LOW (ref 135–145)
Sodium: 127 mmol/L — ABNORMAL LOW (ref 135–145)

## 2023-12-28 LAB — CBC WITH DIFFERENTIAL/PLATELET
Abs Immature Granulocytes: 0.02 K/uL (ref 0.00–0.07)
Basophils Absolute: 0 K/uL (ref 0.0–0.1)
Basophils Relative: 0 %
Eosinophils Absolute: 0.1 K/uL (ref 0.0–0.5)
Eosinophils Relative: 1 %
HCT: 42.2 % (ref 39.0–52.0)
Hemoglobin: 15 g/dL (ref 13.0–17.0)
Immature Granulocytes: 0 %
Lymphocytes Relative: 40 %
Lymphs Abs: 4.1 K/uL — ABNORMAL HIGH (ref 0.7–4.0)
MCH: 29.2 pg (ref 26.0–34.0)
MCHC: 35.5 g/dL (ref 30.0–36.0)
MCV: 82.3 fL (ref 80.0–100.0)
Monocytes Absolute: 0.5 K/uL (ref 0.1–1.0)
Monocytes Relative: 5 %
Neutro Abs: 5.5 K/uL (ref 1.7–7.7)
Neutrophils Relative %: 54 %
Platelets: 426 K/uL — ABNORMAL HIGH (ref 150–400)
RBC: 5.13 MIL/uL (ref 4.22–5.81)
RDW: 12.4 % (ref 11.5–15.5)
WBC: 10.2 K/uL (ref 4.0–10.5)
nRBC: 0 % (ref 0.0–0.2)

## 2023-12-28 LAB — GLUCOSE, CAPILLARY
Glucose-Capillary: 250 mg/dL — ABNORMAL HIGH (ref 70–99)
Glucose-Capillary: 258 mg/dL — ABNORMAL HIGH (ref 70–99)
Glucose-Capillary: 267 mg/dL — ABNORMAL HIGH (ref 70–99)
Glucose-Capillary: 301 mg/dL — ABNORMAL HIGH (ref 70–99)
Glucose-Capillary: 306 mg/dL — ABNORMAL HIGH (ref 70–99)
Glucose-Capillary: 417 mg/dL — ABNORMAL HIGH (ref 70–99)

## 2023-12-28 LAB — PHOSPHORUS: Phosphorus: 2.9 mg/dL (ref 2.5–4.6)

## 2023-12-28 LAB — MAGNESIUM: Magnesium: 1.8 mg/dL (ref 1.7–2.4)

## 2023-12-28 MED ORDER — INSULIN GLARGINE 100 UNIT/ML ~~LOC~~ SOLN: 30.0000 [IU] | Freq: Every day | SUBCUTANEOUS | Status: DC

## 2023-12-28 MED ORDER — INSULIN GLARGINE 100 UNIT/ML ~~LOC~~ SOLN: 8.0000 [IU] | Freq: Once | SUBCUTANEOUS | Status: DC

## 2023-12-28 MED ORDER — INSULIN ASPART 100 UNIT/ML IJ SOLN
15.0000 [IU] | Freq: Once | INTRAMUSCULAR | Status: AC
  Administered 2023-12-28: 15 [IU] via SUBCUTANEOUS

## 2023-12-28 MED ORDER — LACTATED RINGERS IV SOLN: INTRAVENOUS | Status: AC

## 2023-12-28 MED ORDER — CLOTRIMAZOLE 1 % EX CREA
TOPICAL_CREAM | Freq: Two times a day (BID) | CUTANEOUS | Status: DC
  Administered 2023-12-28: 1 via TOPICAL
  Filled 2023-12-28: qty 15

## 2023-12-28 MED ORDER — ENOXAPARIN SODIUM 60 MG/0.6ML IJ SOSY
55.0000 mg | PREFILLED_SYRINGE | INTRAMUSCULAR | Status: DC
  Administered 2023-12-29: 55 mg via SUBCUTANEOUS
  Filled 2023-12-28: qty 0.6

## 2023-12-28 MED ORDER — ENOXAPARIN SODIUM 60 MG/0.6ML IJ SOSY: 55.0000 mg | PREFILLED_SYRINGE | INTRAMUSCULAR | Status: DC

## 2023-12-28 MED ORDER — POTASSIUM CHLORIDE CRYS ER 20 MEQ PO TBCR
40.0000 meq | EXTENDED_RELEASE_TABLET | Freq: Four times a day (QID) | ORAL | Status: AC
  Administered 2023-12-28 (×2): 40 meq via ORAL
  Filled 2023-12-28 (×2): qty 2

## 2023-12-28 MED ORDER — INSULIN GLARGINE 100 UNIT/ML ~~LOC~~ SOLN
10.0000 [IU] | Freq: Once | SUBCUTANEOUS | Status: AC
  Administered 2023-12-28: 10 [IU] via SUBCUTANEOUS
  Filled 2023-12-28 (×2): qty 0.1

## 2023-12-28 MED ORDER — LOSARTAN POTASSIUM 50 MG PO TABS
25.0000 mg | ORAL_TABLET | Freq: Every day | ORAL | Status: DC
  Administered 2023-12-28 – 2023-12-29 (×2): 25 mg via ORAL
  Filled 2023-12-28 (×2): qty 1

## 2023-12-28 MED ORDER — INSULIN GLARGINE 100 UNIT/ML ~~LOC~~ SOLN
30.0000 [IU] | Freq: Every day | SUBCUTANEOUS | Status: DC
Start: 2023-12-29 — End: 2023-12-29
  Filled 2023-12-28: qty 0.3

## 2023-12-28 MED ORDER — LACTATED RINGERS IV SOLN
INTRAVENOUS | Status: DC
  Administered 2023-12-28: 1000 mL via INTRAVENOUS

## 2023-12-28 NOTE — Plan of Care (Signed)

## 2023-12-28 NOTE — TOC Initial Note (Signed)
 Transition of Care [facility]) - Initial/Assessment Note    Patient Details  Name: Rodney Rush MRN: [id number] Date of Birth: 01-02-1991  [facility] CM/SW Contact:    Rodney Gell, RN Phone Number: 12/28/2023, 2:13 PM  Clinical Narrative:                  Spoke w patient at bedside.  He states he is from home w wife. He has PC at [facility] and denies barriers to getting to office or getting any of his meds. He states he needs to better with his diet specifically drinking so much juice, we discussed sugar free options and eating fruit instead of drinking juices.  No ICM needs identified at this time    Barriers to Discharge: Continued Medical Work up   Patient Goals and CMS Choice Patient states their goals for this hospitalization and ongoing recovery are:: to go home          Expected Discharge Plan and Services                         DME Arranged: N/A                    Prior Living Arrangements/Services                       Activities of Daily Living      Permission Sought/Granted                  Emotional Assessment              Admission diagnosis:  DKA (diabetic ketoacidosis) (HCC) [E11.10] Diabetic ketoacidosis without coma associated with type 2 diabetes mellitus (HCC) [E11.10] Hyperosmolar hyperglycemic state (HHS) (HCC) [E11.00] Patient Active Problem List   Diagnosis Date Noted   DKA (diabetic ketoacidosis) (HCC) 12/27/2023   Hyperosmolar hyperglycemic state (HHS) (HCC) 08/27/2022   AKI (acute kidney injury) 08/27/2022   Class 2 obesity due to excess calories with body mass index (BMI) of 35.0 to 35.9 in adult 08/27/2022   Candidiasis 08/27/2022   Type 2 diabetes mellitus with hyperglycemia, without long-term current use of insulin  (HCC)    Elevated BP without diagnosis of hypertension    Diverticulitis 07/08/2019   Hyperglycemia    Right lower quadrant abdominal pain    Unspecified open  wound of left hand, initial encounter 03/09/2018   Persons encountering health services in other specified circumstances 02/09/2018   Encounter for screening, unspecified 01/12/2018   PCP:  Rodney Risen, NP Pharmacy:   [id number] - [place], [name] - [address] AT [facility] OF [address] [name] [address] Phone: [phone] Fax: [phone]  [facility] [phone] - [name], [place] - [address] AT [facility] OF [address] [facility] [address] [name] [address] Phone: [phone] Fax: [phone]  [facility] [address] [name] [address] Phone: [phone] Fax: [phone]  [id number] [name], [name] - [address] AT [facility] OF [address] [name] [address] Phone: [phone] Fax: [phone]  [id number] - [name], [place] - [address] AT [facility] [address] [name] [address] Phone: [phone] Fax: [phone]     Social Drivers of Health (SDOH)  Social History: SDOH Screenings   Food Insecurity: No Food Insecurity (12/28/2023)  Housing: Low Risk  (12/28/2023)  Transportation Needs: Unknown (12/28/2023)  Utilities: Not At Risk (12/28/2023)  Social Connections: Unknown (08/08/2021)   Received from [facility]  Tobacco Use: Low Risk  (12/27/2023)   SDOH Interventions:     Readmission Risk Interventions     No data to display

## 2023-12-28 NOTE — Progress Notes (Signed)
 PROGRESS NOTE    Rodney Rush  FMW:[id number] DOB: [birth year] DOA: 12/27/2023 PCP: Shannon, Jaimie, NP   Chief Complaint  Patient presents with   Hyperglycemia    Brief Narrative:   Rodney Rush is a 33 y.o. male with medical history significant of HTN and DM2 p/w HHS (prior admission in 08/2202 for similar).   The patient, who was diagnosed with diabetes last year, presented with flu-like symptoms that began approximately two and a half weeks ago. The patient reported experiencing shakes, vomiting, and dark mucus production. Initially, the patient suspected COVID-19, but a test performed yesterday returned negative. The patient had considered the possibility of a stomach bug or an adverse reaction to Ozempic. Despite the use of insulin  and metformin  to manage blood sugar levels, the patient experienced persistent fatigue, blurry vision, and elevated blood sugar readings (undetectable/high on home meter) from the end of last week, specifically on Wednesday. The patient attempted to alleviate symptoms with orange juice and vitamin C, but subsequently felt worse. This morning, the patient felt too unwell to work and decided to seek medical attention. The patient noted that blood sugar levels had been generally well-controlled, often around the 150-200s, but recently spiked.   In the ED, pt AFVSS. Labs notable for glucose 748, Na 124, and Cr 1.4-->1.03. EDP started Endotool for insulin  mgt and PCU medicine admission for HHS.  Assessment & Plan:   Principal Problem:   DKA (diabetic ketoacidosis) (HCC) Active Problems:   Hyperosmolar hyperglycemic state (HHS) (HCC)  HHS Diabetes mellitus, type II, poorly controlled with hyperglycemia -Presents with hyperglycemia, evidence of HHS -on insulin  drip initially, transition to subcu insulin  overnight-Will increase his Lantus  to 30 units daily (daily will ensure better compliance than twice daily for now and this can be adjusted further as  outpatient if needed. -Continue with insulin  sliding scale -Resume metformin  once stable -Continue with IV fluids - A1c is 13, counseled at length. - Will check lipid panel likely will benefit from statins  HTN On Lisinopril -hydrochlorothiazide but with no refill since April; thus will hold for now - Will start on low-dose losartan  Obesity class III, BMI of 35.26, with comorbidities -He will certainly benefit from GLP-1 agonist, this should be considered as an outpatient      DVT prophylaxis: (Lovenox ) Code Status: (Full) Family Communication: (Discussed with wife at bedside Disposition:   Status is: Inpatient    Consultants:  None  Subjective:  No nausea, no vomiting  Objective: Vitals:   12/27/23 1700 12/27/23 2015 12/28/23 0030 12/28/23 0839  BP:  110/78 114/82 (!) 140/99  Pulse:  86  (!) 111  Resp: (!) 23 17  11   Temp:  98.4 F (36.9 C) 98.1 F (36.7 C) 98.2 F (36.8 C)  TempSrc:  Oral Oral Oral  SpO2:  99%    Weight:      Height:        Intake/Output Summary (Last 24 hours) at 12/28/2023 1154 Last data filed at 12/28/2023 0617 Gross per 24 hour  Intake 2338.33 ml  Output --  Net 2338.33 ml   Filed Weights   12/27/23 0633  Weight: 117.9 kg    Examination:  Awake Alert, Oriented X 3, No new F.N deficits, Normal affect Symmetrical Chest wall movement, Good air movement bilaterally, CTAB RRR,No Gallops,Rubs or new Murmurs, No Parasternal Heave +ve B.Sounds, Abd Soft, No tenderness, No rebound - guarding or rigidity. No Cyanosis, Clubbing or edema, No new Rash or bruise  Data Reviewed: I have personally reviewed following labs and imaging studies  CBC: Recent Labs  Lab 12/27/23 0639 12/27/23 0642 12/27/23 0752 12/27/23 0939 12/28/23 0910  WBC 7.3  --   --  7.4 10.2  NEUTROABS  --   --   --   --  5.5  HGB 15.3 15.6 15.3 14.2 15.0  HCT 42.3 46.0 45.0 39.7 42.2  MCV 80.1  --   --  81.5 82.3  PLT 482*  --   --  467* 426*    Basic  Metabolic Panel: Recent Labs  Lab 12/27/23 0744 12/27/23 0752 12/27/23 0939 12/27/23 1417 12/27/23 1824 12/28/23 0224 12/28/23 0910  NA 124* 124*  --  132* 132* 129* 127*  K 4.1 4.3  --  3.6 3.4* 3.3* 3.7  CL 82*  --   --  93* 94* 92* 91*  CO2 23  --   --  23 25 24 24   GLUCOSE 748*  --   --  184* 147* 289* 337*  BUN 21*  --   --  18 19 25* 22*  CREATININE 1.40*  --  1.08 1.03 1.13 1.26* 1.18  CALCIUM 9.8  --   --  9.7 9.4 8.5* 8.9  MG  --   --   --   --   --   --  1.8  PHOS  --   --   --   --   --   --  2.9    GFR: Estimated Creatinine Clearance: 116.9 mL/min (by C-G formula based on SCr of 1.18 mg/dL).  Liver Function Tests: Recent Labs  Lab 12/27/23 0744  AST 21  ALT 29  ALKPHOS 104  BILITOT 0.8  PROT 7.4  ALBUMIN 4.1    CBG: Recent Labs  Lab 12/27/23 2110 12/27/23 2223 12/27/23 2331 12/28/23 0035 12/28/23 0834  GLUCAP 197* 180* 217* 258* 306*     No results found for this or any previous visit (from the past 240 hours).       Radiology Studies: No results found.      Scheduled Meds:  clotrimazole   Topical BID   [START ON 12/29/2023] enoxaparin  (LOVENOX ) injection  55 mg Subcutaneous Q24H   insulin  aspart  0-15 Units Subcutaneous TID WC   insulin  aspart  5 Units Subcutaneous TID WC   insulin  glargine  10 Units Subcutaneous Once   [START ON 12/29/2023] insulin  glargine  30 Units Subcutaneous Daily   potassium chloride   40 mEq Oral Q6H   Continuous Infusions:  lactated ringers  75 mL/hr at 12/28/23 0617     LOS: 1 day      Brayton Lye, MD [facility]   To contact the attending provider between 7A-7P or the covering provider during after hours 7P-7A, please log into the web site [email or web address] and access using universal [place] password for that web site. If you do not have the password, please call the hospital operator.  12/28/2023, 11:54 AM

## 2023-12-28 NOTE — Inpatient Diabetes Management (Signed)
 Inpatient Diabetes Program Recommendations  AACE/ADA: New Consensus Statement on Inpatient Glycemic Control (2015)  Target Ranges:  Prepandial:   less than 140 mg/dL      Peak postprandial:   less than 180 mg/dL (1-2 hours)      Critically ill patients:  140 - 180 mg/dL   Lab Results  Component Value Date   GLUCAP 306 (H) 12/28/2023   HGBA1C 13.0 (H) 12/27/2023    Review of Glycemic Control  Latest Reference Range & Units 12/27/23 21:10 12/27/23 22:23 12/27/23 23:31 12/28/23 00:35 12/28/23 08:34  Glucose-Capillary 70 - 99 mg/dL 802 (H) 819 (H) 782 (H) 258 (H) 306 (H)  (H): Data is abnormally high  Diabetes history: DM2 Outpatient Diabetes medications:  Novolog  30 units at bedtime (ran out yesterday), Metformin  500 mg BID, Ozempic 1 mg Qweek (Monday) Current orders for Inpatient glycemic control: Lantus  20 units every day, Novolog  0-15 units TID and 5 units TID with meals   Inpatient Diabetes Program Recommendations:    Based on IV insulin  drip rates and current glucose of 306 mg/dL, Might consider:  Lantus  20 units BID  Thank you, Wyvonna Pinal, MSN, CDCES Diabetes Coordinator Inpatient Diabetes Program [phone] (team pager from 8a-5p)

## 2023-12-28 NOTE — Progress Notes (Signed)
 Insulin  drip and D5LR discontinued and LR @ 75 mL/hr started.

## 2023-12-29 ENCOUNTER — Other Ambulatory Visit (HOSPITAL_COMMUNITY): Payer: Self-pay

## 2023-12-29 DIAGNOSIS — E1100 Type 2 diabetes mellitus with hyperosmolarity without nonketotic hyperglycemic-hyperosmolar coma (NKHHC): Secondary | ICD-10-CM

## 2023-12-29 LAB — GLUCOSE, CAPILLARY
Glucose-Capillary: 250 mg/dL — ABNORMAL HIGH (ref 70–99)
Glucose-Capillary: 270 mg/dL — ABNORMAL HIGH (ref 70–99)
Glucose-Capillary: 283 mg/dL — ABNORMAL HIGH (ref 70–99)

## 2023-12-29 LAB — CBC
HCT: 39.6 % (ref 39.0–52.0)
Hemoglobin: 13.7 g/dL (ref 13.0–17.0)
MCH: 28.5 pg (ref 26.0–34.0)
MCHC: 34.6 g/dL (ref 30.0–36.0)
MCV: 82.5 fL (ref 80.0–100.0)
Platelets: 421 K/uL — ABNORMAL HIGH (ref 150–400)
RBC: 4.8 MIL/uL (ref 4.22–5.81)
RDW: 12.2 % (ref 11.5–15.5)
WBC: 6.3 K/uL (ref 4.0–10.5)
nRBC: 0 % (ref 0.0–0.2)

## 2023-12-29 LAB — LIPID PANEL
Cholesterol: 208 mg/dL — ABNORMAL HIGH (ref 0–200)
HDL: 23 mg/dL — ABNORMAL LOW (ref >40)
LDL Cholesterol: 140 mg/dL — ABNORMAL HIGH (ref 0–99)
Total CHOL/HDL Ratio: 9 ratio
Triglycerides: 227 mg/dL — ABNORMAL HIGH (ref <150)
VLDL: 45 mg/dL — ABNORMAL HIGH (ref 0–40)

## 2023-12-29 LAB — BASIC METABOLIC PANEL WITH GFR
Anion gap: 9 (ref 5–15)
BUN: 16 mg/dL (ref 6–20)
CO2: 24 mmol/L (ref 22–32)
Calcium: 8.6 mg/dL — ABNORMAL LOW (ref 8.9–10.3)
Chloride: 97 mmol/L — ABNORMAL LOW (ref 98–111)
Creatinine, Ser: 0.96 mg/dL (ref 0.61–1.24)
GFR, Estimated: >60 mL/min (ref >60)
Glucose, Bld: 247 mg/dL — ABNORMAL HIGH (ref 70–99)
Potassium: 3.9 mmol/L (ref 3.5–5.1)
Sodium: 130 mmol/L — ABNORMAL LOW (ref 135–145)

## 2023-12-29 MED ORDER — INSULIN GLARGINE 100 UNIT/ML ~~LOC~~ SOLN
35.0000 [IU] | Freq: Every day | SUBCUTANEOUS | Status: DC
  Administered 2023-12-29: 35 [IU] via SUBCUTANEOUS
  Filled 2023-12-29: qty 0.35

## 2023-12-29 MED ORDER — BLOOD GLUCOSE TEST VI STRP
1.0000 | ORAL_STRIP | Freq: Three times a day (TID) | 0 refills | Status: AC
  Filled 2023-12-29: qty 100, 30d supply, fill #0

## 2023-12-29 MED ORDER — LOSARTAN POTASSIUM 25 MG PO TABS
25.0000 mg | ORAL_TABLET | Freq: Every day | ORAL | 0 refills | Status: AC
  Filled 2023-12-29: qty 30, 30d supply, fill #0

## 2023-12-29 MED ORDER — ROSUVASTATIN CALCIUM 20 MG PO TABS
20.0000 mg | ORAL_TABLET | Freq: Every day | ORAL | 0 refills | Status: AC
  Filled 2023-12-29: qty 30, 30d supply, fill #0

## 2023-12-29 MED ORDER — INSULIN GLARGINE 100 UNIT/ML SOLOSTAR PEN
35.0000 [IU] | PEN_INJECTOR | Freq: Every day | SUBCUTANEOUS | 0 refills | Status: AC
  Filled 2023-12-29: qty 15, 42d supply, fill #0

## 2023-12-29 MED ORDER — BLOOD GLUCOSE MONITOR SYSTEM W/DEVICE KIT
1.0000 | PACK | Freq: Three times a day (TID) | 0 refills | Status: AC
  Filled 2023-12-29: qty 1, 30d supply, fill #0

## 2023-12-29 MED ORDER — ROSUVASTATIN CALCIUM 20 MG PO TABS
20.0000 mg | ORAL_TABLET | Freq: Every day | ORAL | Status: DC
  Administered 2023-12-29: 20 mg via ORAL
  Filled 2023-12-29: qty 1

## 2023-12-29 MED ORDER — INSULIN PEN NEEDLE 32G X 4 MM MISC
1.0000 | Freq: Three times a day (TID) | 0 refills | Status: AC
  Filled 2023-12-29: qty 100, 30d supply, fill #0

## 2023-12-29 MED ORDER — ACCU-CHEK SOFTCLIX LANCETS MISC
1.0000 | Freq: Three times a day (TID) | 0 refills | Status: AC
  Filled 2023-12-29: qty 100, 30d supply, fill #0

## 2023-12-29 MED ORDER — LANCET DEVICE MISC
1.0000 | Freq: Three times a day (TID) | 0 refills | Status: AC
  Filled 2023-12-29: qty 1, fill #0

## 2023-12-29 NOTE — Progress Notes (Signed)
 Nutrition Education Note  Patient discussed during progression and MD asked RD for nutrition education regarding diabetes.   Lab Results  Component Value Date   HGBA1C 13.0 (H) 12/27/2023    RD provided Carbohydrate Counting for People with Diabetes handout from the Academy of Nutrition and Dietetics. Discussed different food groups and their effects on blood sugar, emphasizing carbohydrate-containing foods. Provided list of carbohydrates and recommended serving sizes of common foods.  Discussed importance of controlled and consistent carbohydrate intake throughout the day. Provided examples of ways to balance meals/snacks and encouraged intake of high-fiber, whole grain complex carbohydrates. Teach back method used.   He reports he will start with beverage choices first as he drinks quite a bit of juice and soda. Reports work does not have many diet beverage options. He will then move on to limiting fried and convenience foods. Currently on Ozempic and has lost 45lbs.   Expect adequate compliance. He requested how to obtain a referral for endocrinologist. Encouraged him to ask is PCP for this as he is imminently discharging.   Body mass index is 35.26 kg/m. Pt meets criteria for obesity, class II based on current BMI.  Current diet order is HH/carb modified, patient is consuming adequate amounts of meals at this time. Labs and medications reviewed. No further nutrition interventions warranted at this time. RD contact information provided. If additional nutrition issues arise, please re-consult RD.  Blair Deaner MS, RD, LDN Registered Dietitian Clinical Nutrition RD Inpatient Contact Info in Amion

## 2023-12-29 NOTE — TOC Transition Note (Signed)
 Transition of Care [facility]) - Discharge Note   Patient Details  Name: Rodney Rush MRN: [id number] Date of Birth: Aug 20, 1990  [facility] CM/SW Contact:  Tom-Johnson, Harvest Muskrat, RN Phone Number: 12/29/2023, 11:31 AM   Clinical Narrative:     Patient is scheduled for discharge today.  Readmission Risk Assessment done. Outpatient referral, hospital f/u and discharge instructions on AVS. Prescriptions sent to [facility] pharmacy and patient will receive meds prior discharge. No ICM needs or recommendations noted. Wife, Whittney to transport at discharge.  No further ICM needs noted.       Final next level of care: Home/Self Care Barriers to Discharge: Barriers Resolved   Patient Goals and CMS Choice Patient states their goals for this hospitalization and ongoing recovery are:: To return home CMS Medicare.gov Compare Post Acute Care list provided to:: Patient Choice offered to / list presented to : NA      Discharge Placement                Patient to be transferred to facility by: Wife Name of family member notified: [facility]    Discharge Plan and Services Additional resources added to the After Visit Summary for                  DME Arranged: N/A DME Agency: NA       HH Arranged: NA HH Agency: NA        Social Drivers of Health (SDOH) Interventions SDOH Screenings   Food Insecurity: No Food Insecurity (12/28/2023)  Housing: Low Risk  (12/28/2023)  Transportation Needs: Unknown (12/28/2023)  Utilities: Not At Risk (12/28/2023)  Social Connections: Unknown (08/08/2021)   Received from [facility]  Tobacco Use: Low Risk  (12/27/2023)     Readmission Risk Interventions    12/29/2023   11:28 AM  Readmission Risk Prevention Plan  Post Dischage Appt Complete  Medication Screening Complete  Transportation Screening Complete

## 2023-12-29 NOTE — Progress Notes (Signed)
 Discharge Nurse Summary: DC order noted per MD. DC RN at bedside with patient. Patient agreeable with discharge plan, partner present at bedside. AVS printed/reviewed. PIVs removed, skin intact. No DME needs. No home meds. TOC meds pending pickup. CP/Edu resolved. Telemonitor returned to charging station. All belongings accounted for. Patient/partner instructed to pickup TOC meds on the way out prior to exit of the hospital. Patient independent in ambulation stating no need for wheelchair transport. Patient/partner off the unit for discharge by private auto.    Rosario EMERSON Lund, RN

## 2023-12-29 NOTE — Discharge Instructions (Addendum)
 Follow with Primary MD Shannon, Jaimie, NP in 7 days   Get CBC, CMP,  checked  by Primary MD next visit.    Activity: As tolerated with Full fall precautions use walker/cane & assistance as needed   Disposition Home    Diet: Heart Healthy /carb modified    On your next visit with your primary care physician please Get Medicines reviewed and adjusted.   Please request your Prim.MD to go over all Hospital Tests and Procedure/Radiological results at the follow up, please get all Hospital records sent to your Prim MD by signing hospital release before you go home.   If you experience worsening of your admission symptoms, develop shortness of breath, life threatening emergency, suicidal or homicidal thoughts you must seek medical attention immediately by calling 911 or calling your MD immediately  if symptoms less severe.  You Must read complete instructions/literature along with all the possible adverse reactions/side effects for all the Medicines you take and that have been prescribed to you. Take any new Medicines after you have completely understood and accpet all the possible adverse reactions/side effects.   Do not drive, operating heavy machinery, perform activities at heights, swimming or participation in water activities or provide baby sitting services if your were admitted for syncope or siezures until you have seen by Primary MD or a Neurologist and advised to do so again.  Do not drive when taking Pain medications.    Do not take more than prescribed Pain, Sleep and Anxiety Medications  Special Instructions: If you have smoked or chewed Tobacco  in the last 2 yrs please stop smoking, stop any regular Alcohol  and or any Recreational drug use.  Wear Seat belts while driving.   Please note  You were cared for by a hospitalist during your hospital stay. If you have any questions about your discharge medications or the care you received while you were in the hospital after  you are discharged, you can call the unit and asked to speak with the hospitalist on call if the hospitalist that took care of you is not available. Once you are discharged, your primary care physician will handle any further medical issues. Please note that NO REFILLS for any discharge medications will be authorized once you are discharged, as it is imperative that you return to your primary care physician (or establish a relationship with a primary care physician if you do not have one) for your aftercare needs so that they can reassess your need for medications and monitor your lab values.

## 2023-12-29 NOTE — Discharge Summary (Addendum)
 Physician Discharge Summary  Rodney Rush FMW:[id number] DOB: [birth year] DOA: 12/27/2023  PCP: Shannon, Jaimie, NP  Admit date: 12/27/2023 Discharge date: 12/29/2023  Admitted From: (Home) Disposition:  (Home )  Recommendations for Outpatient Follow-up:  Follow up with PCP in 1-2 weeks Please obtain BMP/CBC in one week Ambulatory referral has been sent to endocrinology    Diet recommendation: Heart Healthy / Carb Modified  Brief/Interim Summary:  Rodney Rush is a 33 y.o. male with medical history significant of HTN and DM2 p/w HHS (prior admission in 08/2202 for similar).   The patient, who was diagnosed with diabetes last year, presented with flu-like symptoms that began approximately two and a half weeks ago. The patient reported experiencing shakes, vomiting, and dark mucus production. Initially, the patient suspected COVID-19, but a test performed yesterday returned negative. The patient had considered the possibility of a stomach bug or an adverse reaction to Ozempic. Despite the use of insulin  and metformin  to manage blood sugar levels, the patient experienced persistent fatigue, blurry vision, and elevated blood sugar readings (undetectable/high on home meter) from the end of last week, specifically on Wednesday. The patient attempted to alleviate symptoms with orange juice and vitamin C, but subsequently felt worse. This morning, the patient felt too unwell to work and decided to seek medical attention. The patient noted that blood sugar levels had been generally well-controlled, often around the 150-200s, but recently spiked.   In the ED, pt AFVSS. Labs notable for glucose 748, Na 124, and Cr 1.4-->1.03. EDP started Endotool for insulin  mgt and PCU medicine admission for HHS.   HHS Diabetes mellitus, type II, poorly controlled with hyperglycemia -Presents with hyperglycemia, evidence of HHS -on insulin  drip initially, transitioned to subcu insulin , Lantus  dose has been  adjusted, he will be discharged on 35 units daily, likely this will need to be gradually uptitrated as an outpatient, but he was counseled by nutritionist, and he will follow low-carb diet, as well he is to resume his home metformin  and Ozempic . - A1c is 13, counseled at length.  Hyperlipidemia -LDL is 140, started on Crestor  HTN On Lisinopril -hydrochlorothiazide but with no refill since April; thus will hold for now - Started on low-dose losartan, can uptitrate as blood pressure allows   Obesity class III, BMI of 35.26, with comorbidities - Continue with Ozempic, dose can be uptitrated as he still on 1 mg.   Hyponatremia - Improved with hydration  Discharge Diagnoses:  Principal Problem:   DKA (diabetic ketoacidosis) (HCC) Active Problems:   Hyperosmolar hyperglycemic state (HHS) [facility])    Discharge Instructions  Discharge Instructions     Ambulatory referral to Endocrinology   Complete by: As directed    Diet - low sodium heart healthy   Complete by: As directed    Discharge instructions   Complete by: As directed    Follow with Primary MD [name], NP in 7 days   Get CBC, CMP,  checked  by Primary MD next visit.    Activity: As tolerated with Full fall precautions use walker/cane & assistance as needed   Disposition Home    Diet: Heart Healthy /carb modified    On your next visit with your primary care physician please Get Medicines reviewed and adjusted.   Please request your Prim.MD to go over all Hospital Tests and Procedure/Radiological results at the follow up, please get all Hospital records sent to your Prim MD by signing hospital release before you go home.   If  you experience worsening of your admission symptoms, develop shortness of breath, life threatening emergency, suicidal or homicidal thoughts you must seek medical attention immediately by calling 911 or calling your MD immediately  if symptoms less severe.  You Must read complete  instructions/literature along with all the possible adverse reactions/side effects for all the Medicines you take and that have been prescribed to you. Take any new Medicines after you have completely understood and accpet all the possible adverse reactions/side effects.   Do not drive, operating heavy machinery, perform activities at heights, swimming or participation in water activities or provide baby sitting services if your were admitted for syncope or siezures until you have seen by Primary MD or a Neurologist and advised to do so again.  Do not drive when taking Pain medications.    Do not take more than prescribed Pain, Sleep and Anxiety Medications  Special Instructions: If you have smoked or chewed Tobacco  in the last 2 yrs please stop smoking, stop any regular Alcohol  and or any Recreational drug use.  Wear Seat belts while driving.   Please note  You were cared for by a hospitalist during your hospital stay. If you have any questions about your discharge medications or the care you received while you were in the hospital after you are discharged, you can call the unit and asked to speak with the hospitalist on call if the hospitalist that took care of you is not available. Once you are discharged, your primary care physician will handle any further medical issues. Please note that NO REFILLS for any discharge medications will be authorized once you are discharged, as it is imperative that you return to your primary care physician (or establish a relationship with a primary care physician if you do not have one) for your aftercare needs so that they can reassess your need for medications and monitor your lab values.   Increase activity slowly   Complete by: As directed       Allergies as of 12/29/2023   No Known Allergies      Medication List     STOP taking these medications    chlorthalidone 25 MG tablet Commonly known as: HYGROTON   lisinopril -hydrochlorothiazide  20-25 MG tablet Commonly known as: ZESTORETIC       TAKE these medications    Blood Glucose Monitoring Suppl Devi 1 each by Does not apply route 3 (three) times daily. May dispense any manufacturer covered by patient's insurance.   BLOOD GLUCOSE TEST STRIPS Strp 1 each by Does not apply route 3 (three) times daily. Use as directed to check blood sugar. May dispense any manufacturer covered by patient's insurance and fits patient's device.   Fluocinolone Acetonide Body 0.01 % Oil Apply 1 Application topically daily as needed (itching).   insulin  glargine 100 UNIT/ML Solostar Pen Commonly known as: LANTUS  Inject 35 Units into the skin daily. May substitute as needed per insurance. What changed:  how much to take additional instructions   Lancet Device Misc 1 each by Does not apply route 3 (three) times daily. May dispense any manufacturer covered by patient's insurance.   Lancets Misc 1 each by Does not apply route 3 (three) times daily. Use as directed to check blood sugar. May dispense any manufacturer covered by patient's insurance and fits patient's device.   losartan 25 MG tablet Commonly known as: COZAAR Take 1 tablet (25 mg total) by mouth daily. Start taking on: December 30, 2023   metFORMIN  500  MG tablet Commonly known as: GLUCOPHAGE  Take 1 tablet (500 mg total) by mouth 2 (two) times daily with a meal. What changed: how much to take   nystatin  powder Commonly known as: MYCOSTATIN /NYSTOP  Apply topically 2 (two) times daily. Apply to penis   Ozempic (1 MG/DOSE) 4 MG/3ML Sopn Generic drug: Semaglutide (1 MG/DOSE) Inject 1 mg into the skin every 7 (seven) days.   Pen Needles 31G X 5 MM Misc 1 each by Does not apply route 3 (three) times daily. May dispense any manufacturer covered by patient's insurance.   rosuvastatin 20 MG tablet Commonly known as: CRESTOR Take 1 tablet (20 mg total) by mouth daily.        No Known  Allergies     Procedures/Studies: No results found.    Subjective: No significant events overnight, he denies any complaints  Discharge Exam: Vitals:   12/29/23 0507 12/29/23 0748  BP: 117/65 (!) 140/95  Pulse: 60   Resp: 17 18  Temp: 98.7 F (37.1 C) 97.7 F (36.5 C)  SpO2:  97%   Vitals:   12/28/23 1625 12/28/23 2031 12/29/23 0507 12/29/23 0748  BP: 132/83 (!) 150/93 117/65 (!) 140/95  Pulse: 97 82 60   Resp: 19 17 17 18   Temp: 98.8 F (37.1 C) 99 F (37.2 C) 98.7 F (37.1 C) 97.7 F (36.5 C)  TempSrc: Oral Oral Oral   SpO2:  99%  97%  Weight:      Height:        General: Pt is alert, awake, not in acute distress Cardiovascular: RRR, S1/S2 +, no rubs, no gallops Respiratory: CTA bilaterally, no wheezing, no rhonchi Abdominal: Soft, NT, ND, bowel sounds + Extremities: no edema, no cyanosis    The results of significant diagnostics from this hospitalization (including imaging, microbiology, ancillary and laboratory) are listed below for reference.     Microbiology: No results found for this or any previous visit (from the past 240 hours).   Labs: BNP (last 3 results) No results for input(s): BNP in the last 8760 hours. Basic Metabolic Panel: Recent Labs  Lab 12/27/23 1417 12/27/23 1824 12/28/23 0224 12/28/23 0910 12/29/23 0753  NA 132* 132* 129* 127* 130*  K 3.6 3.4* 3.3* 3.7 3.9  CL 93* 94* 92* 91* 97*  CO2 23 25 24 24 24   GLUCOSE 184* 147* 289* 337* 247*  BUN 18 19 25* 22* 16  CREATININE 1.03 1.13 1.26* 1.18 0.96  CALCIUM 9.7 9.4 8.5* 8.9 8.6*  MG  --   --   --  1.8  --   PHOS  --   --   --  2.9  --    Liver Function Tests: Recent Labs  Lab 12/27/23 0744  AST 21  ALT 29  ALKPHOS 104  BILITOT 0.8  PROT 7.4  ALBUMIN 4.1   No results for input(s): LIPASE, AMYLASE in the last 168 hours. No results for input(s): AMMONIA in the last 168 hours. CBC: Recent Labs  Lab 12/27/23 0639 12/27/23 9357 12/27/23 0752  12/27/23 0939 12/28/23 0910 12/29/23 0753  WBC 7.3  --   --  7.4 10.2 6.3  NEUTROABS  --   --   --   --  5.5  --   HGB 15.3 15.6 15.3 14.2 15.0 13.7  HCT 42.3 46.0 45.0 39.7 42.2 39.6  MCV 80.1  --   --  81.5 82.3 82.5  PLT 482*  --   --  467* 426* 421*   Cardiac  Enzymes: No results for input(s): CKTOTAL, CKMB, CKMBINDEX, TROPONINI in the last 168 hours. BNP: Invalid input(s): POCBNP CBG: Recent Labs  Lab 12/28/23 1622 12/28/23 2030 12/28/23 2243 12/29/23 0007 12/29/23 0812  GLUCAP 417* 267* 250* 250* 270*   D-Dimer No results for input(s): DDIMER in the last 72 hours. Hgb A1c Recent Labs    12/27/23 1824  HGBA1C 13.0*   Lipid Profile Recent Labs    12/29/23 0753  CHOL 208*  HDL 23*  LDLCALC 140*  TRIG 227*  CHOLHDL 9.0   Thyroid function studies No results for input(s): TSH, T4TOTAL, T3FREE, THYROIDAB in the last 72 hours.  Invalid input(s): FREET3 Anemia work up No results for input(s): VITAMINB12, FOLATE, FERRITIN, TIBC, IRON, RETICCTPCT in the last 72 hours. Urinalysis    Component Value Date/Time   COLORURINE STRAW (A) 12/27/2023 0636   APPEARANCEUR CLEAR 12/27/2023 0636   LABSPEC 1.027 12/27/2023 0636   PHURINE 6.0 12/27/2023 0636   GLUCOSEU >=500 (A) 12/27/2023 0636   HGBUR NEGATIVE 12/27/2023 0636   BILIRUBINUR NEGATIVE 12/27/2023 0636   KETONESUR NEGATIVE 12/27/2023 0636   PROTEINUR NEGATIVE 12/27/2023 0636   UROBILINOGEN 1.0 11/09/2008 1858   NITRITE NEGATIVE 12/27/2023 0636   LEUKOCYTESUR NEGATIVE 12/27/2023 0636   Sepsis Labs Recent Labs  Lab 12/27/23 0639 12/27/23 0939 12/28/23 0910 12/29/23 0753  WBC 7.3 7.4 10.2 6.3   Microbiology No results found for this or any previous visit (from the past 240 hours).   Time coordinating discharge: Over 30 minutes  SIGNED:   Brayton Lye, MD  [facility] 12/29/2023, 11:19 AM Pager   If 7PM-7AM, please contact  night-coverage [email or web address]

## 2023-12-29 NOTE — Plan of Care (Signed)

## 2023-12-29 NOTE — Inpatient Diabetes Management (Signed)
 Inpatient Diabetes Program Recommendations  AACE/ADA: New Consensus Statement on Inpatient Glycemic Control (2015)  Target Ranges:  Prepandial:   less than 140 mg/dL      Peak postprandial:   less than 180 mg/dL (1-2 hours)      Critically ill patients:  140 - 180 mg/dL   Lab Results  Component Value Date   GLUCAP 270 (H) 12/29/2023   HGBA1C 13.0 (H) 12/27/2023    Review of Glycemic Control  Latest Reference Range & Units 12/28/23 08:34 12/28/23 12:54 12/28/23 16:22 12/28/23 20:30 12/28/23 22:43 12/29/23 00:07 12/29/23 08:12  Glucose-Capillary 70 - 99 mg/dL 693 (H) 698 (H) 582 (H) 267 (H) 250 (H) 250 (H) 270 (H)  (H): Data is abnormally high \ Diabetes history: DM2 Outpatient Diabetes medications:  Novolog  30 units at bedtime (ran out yesterday), Metformin  500 mg BID, Ozempic 1 mg Qweek (Monday) Current orders for Inpatient glycemic control: Lantus  35 units every day, Novolog  0-15 units TID and 5 units TID with meals     Inpatient Diabetes Program Recommendations:    Lantus  25 units BID Novolog  8 units TID with meals if he consumes at least 50%  Thank you, Wyvonna Pinal, MSN, CDCES Diabetes Coordinator Inpatient Diabetes Program [phone] (team pager from 8a-5p)

## 2024-08-06 ENCOUNTER — Ambulatory Visit: Admitting: "Endocrinology
